# Patient Record
Sex: Female | Born: 1935
Health system: Southern US, Community
[De-identification: ages and names within clinical notes are randomized; demographics above are authoritative.]

## PROBLEM LIST (undated history)

## (undated) DIAGNOSIS — I10 Essential (primary) hypertension: Secondary | ICD-10-CM

## (undated) DIAGNOSIS — F0390 Unspecified dementia without behavioral disturbance: Secondary | ICD-10-CM

## (undated) HISTORY — PX: ABDOMINAL HYSTERECTOMY: SHX81

## (undated) HISTORY — PX: REPLACEMENT TOTAL KNEE BILATERAL: SUR1225

---

## 1999-10-31 ENCOUNTER — Encounter: Admission: RE | Admit: 1999-10-31 | Discharge: 1999-11-21 | Payer: Self-pay | Admitting: Orthopaedic Surgery

## 2000-02-14 ENCOUNTER — Encounter: Admission: RE | Admit: 2000-02-14 | Discharge: 2000-02-14 | Payer: Self-pay | Admitting: Internal Medicine

## 2000-02-14 ENCOUNTER — Encounter: Payer: Self-pay | Admitting: Internal Medicine

## 2000-10-22 ENCOUNTER — Encounter: Admission: RE | Admit: 2000-10-22 | Discharge: 2000-10-22 | Payer: Self-pay | Admitting: Internal Medicine

## 2000-10-22 ENCOUNTER — Encounter: Payer: Self-pay | Admitting: Internal Medicine

## 2001-02-16 ENCOUNTER — Encounter: Admission: RE | Admit: 2001-02-16 | Discharge: 2001-02-16 | Payer: Self-pay | Admitting: Internal Medicine

## 2001-02-16 ENCOUNTER — Encounter: Payer: Self-pay | Admitting: Internal Medicine

## 2001-12-27 ENCOUNTER — Encounter: Admission: RE | Admit: 2001-12-27 | Discharge: 2001-12-27 | Payer: Self-pay | Admitting: Internal Medicine

## 2001-12-27 ENCOUNTER — Encounter: Payer: Self-pay | Admitting: Internal Medicine

## 2002-01-21 ENCOUNTER — Emergency Department (HOSPITAL_COMMUNITY): Admission: EM | Admit: 2002-01-21 | Discharge: 2002-01-21 | Payer: Self-pay | Admitting: Emergency Medicine

## 2002-01-21 ENCOUNTER — Encounter: Payer: Self-pay | Admitting: Emergency Medicine

## 2002-02-17 ENCOUNTER — Encounter: Payer: Self-pay | Admitting: Internal Medicine

## 2002-02-17 ENCOUNTER — Encounter: Admission: RE | Admit: 2002-02-17 | Discharge: 2002-02-17 | Payer: Self-pay | Admitting: Internal Medicine

## 2003-02-21 ENCOUNTER — Encounter: Admission: RE | Admit: 2003-02-21 | Discharge: 2003-02-21 | Payer: Self-pay | Admitting: Internal Medicine

## 2003-02-21 ENCOUNTER — Encounter: Payer: Self-pay | Admitting: Internal Medicine

## 2003-09-22 ENCOUNTER — Ambulatory Visit (HOSPITAL_COMMUNITY): Admission: RE | Admit: 2003-09-22 | Discharge: 2003-09-22 | Payer: Self-pay | Admitting: Orthopaedic Surgery

## 2003-12-12 ENCOUNTER — Emergency Department (HOSPITAL_COMMUNITY): Admission: EM | Admit: 2003-12-12 | Discharge: 2003-12-12 | Payer: Self-pay | Admitting: Emergency Medicine

## 2004-02-23 ENCOUNTER — Encounter: Admission: RE | Admit: 2004-02-23 | Discharge: 2004-02-23 | Payer: Self-pay | Admitting: Internal Medicine

## 2005-02-01 ENCOUNTER — Ambulatory Visit: Payer: Self-pay | Admitting: Physical Medicine & Rehabilitation

## 2005-02-01 ENCOUNTER — Inpatient Hospital Stay (HOSPITAL_COMMUNITY): Admission: RE | Admit: 2005-02-01 | Discharge: 2005-02-07 | Payer: Self-pay | Admitting: Orthopaedic Surgery

## 2005-02-25 ENCOUNTER — Encounter: Admission: RE | Admit: 2005-02-25 | Discharge: 2005-02-25 | Payer: Self-pay | Admitting: Internal Medicine

## 2005-09-28 ENCOUNTER — Encounter: Admission: RE | Admit: 2005-09-28 | Discharge: 2005-09-28 | Payer: Self-pay | Admitting: Orthopaedic Surgery

## 2006-01-13 ENCOUNTER — Inpatient Hospital Stay (HOSPITAL_COMMUNITY): Admission: RE | Admit: 2006-01-13 | Discharge: 2006-01-14 | Payer: Self-pay | Admitting: Orthopaedic Surgery

## 2006-03-11 ENCOUNTER — Encounter: Admission: RE | Admit: 2006-03-11 | Discharge: 2006-03-11 | Payer: Self-pay | Admitting: Internal Medicine

## 2007-03-13 ENCOUNTER — Encounter: Admission: RE | Admit: 2007-03-13 | Discharge: 2007-03-13 | Payer: Self-pay | Admitting: Internal Medicine

## 2008-03-14 ENCOUNTER — Encounter: Admission: RE | Admit: 2008-03-14 | Discharge: 2008-03-14 | Payer: Self-pay | Admitting: Internal Medicine

## 2009-03-13 ENCOUNTER — Ambulatory Visit: Payer: Self-pay | Admitting: Internal Medicine

## 2009-03-13 ENCOUNTER — Ambulatory Visit: Payer: Self-pay | Admitting: Cardiology

## 2009-03-13 ENCOUNTER — Inpatient Hospital Stay (HOSPITAL_COMMUNITY): Admission: RE | Admit: 2009-03-13 | Discharge: 2009-03-18 | Payer: Self-pay | Admitting: Orthopaedic Surgery

## 2009-03-15 ENCOUNTER — Encounter (INDEPENDENT_AMBULATORY_CARE_PROVIDER_SITE_OTHER): Payer: Self-pay | Admitting: Orthopaedic Surgery

## 2009-03-16 ENCOUNTER — Ambulatory Visit: Payer: Self-pay | Admitting: Physical Medicine & Rehabilitation

## 2009-04-10 ENCOUNTER — Encounter: Admission: RE | Admit: 2009-04-10 | Discharge: 2009-04-10 | Payer: Self-pay | Admitting: Internal Medicine

## 2009-08-31 ENCOUNTER — Ambulatory Visit (HOSPITAL_COMMUNITY): Admission: RE | Admit: 2009-08-31 | Discharge: 2009-08-31 | Payer: Self-pay | Admitting: Gastroenterology

## 2010-04-11 ENCOUNTER — Encounter: Admission: RE | Admit: 2010-04-11 | Discharge: 2010-04-11 | Payer: Self-pay | Admitting: Internal Medicine

## 2010-07-20 IMAGING — CR DG CHEST 2V
2 series · 2 of 2 positions shown · non-contrast
Comparison: 01/30/2005.

CLINICAL DATA: 83-year-old female preoperative study for right knee
surgery.

CHEST - 2 VIEW

[view not recorded (1 of 2)]
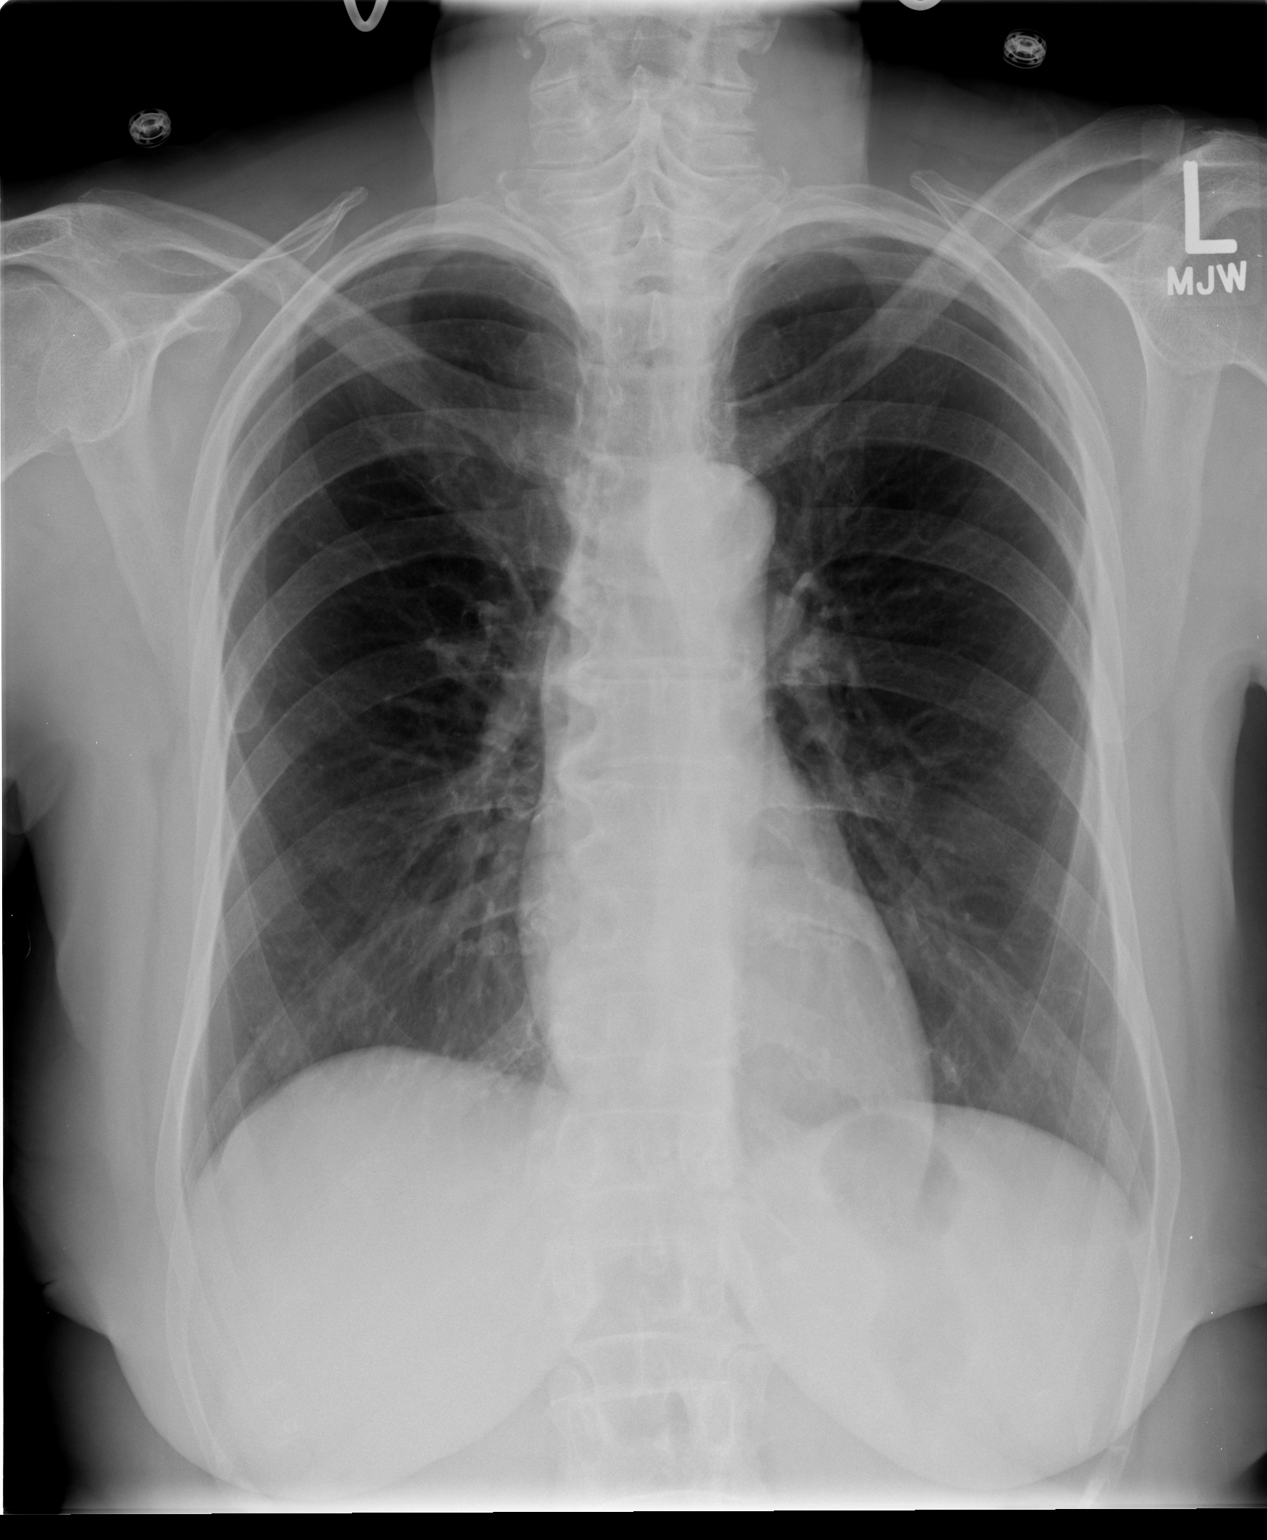

[view not recorded (2 of 2)]
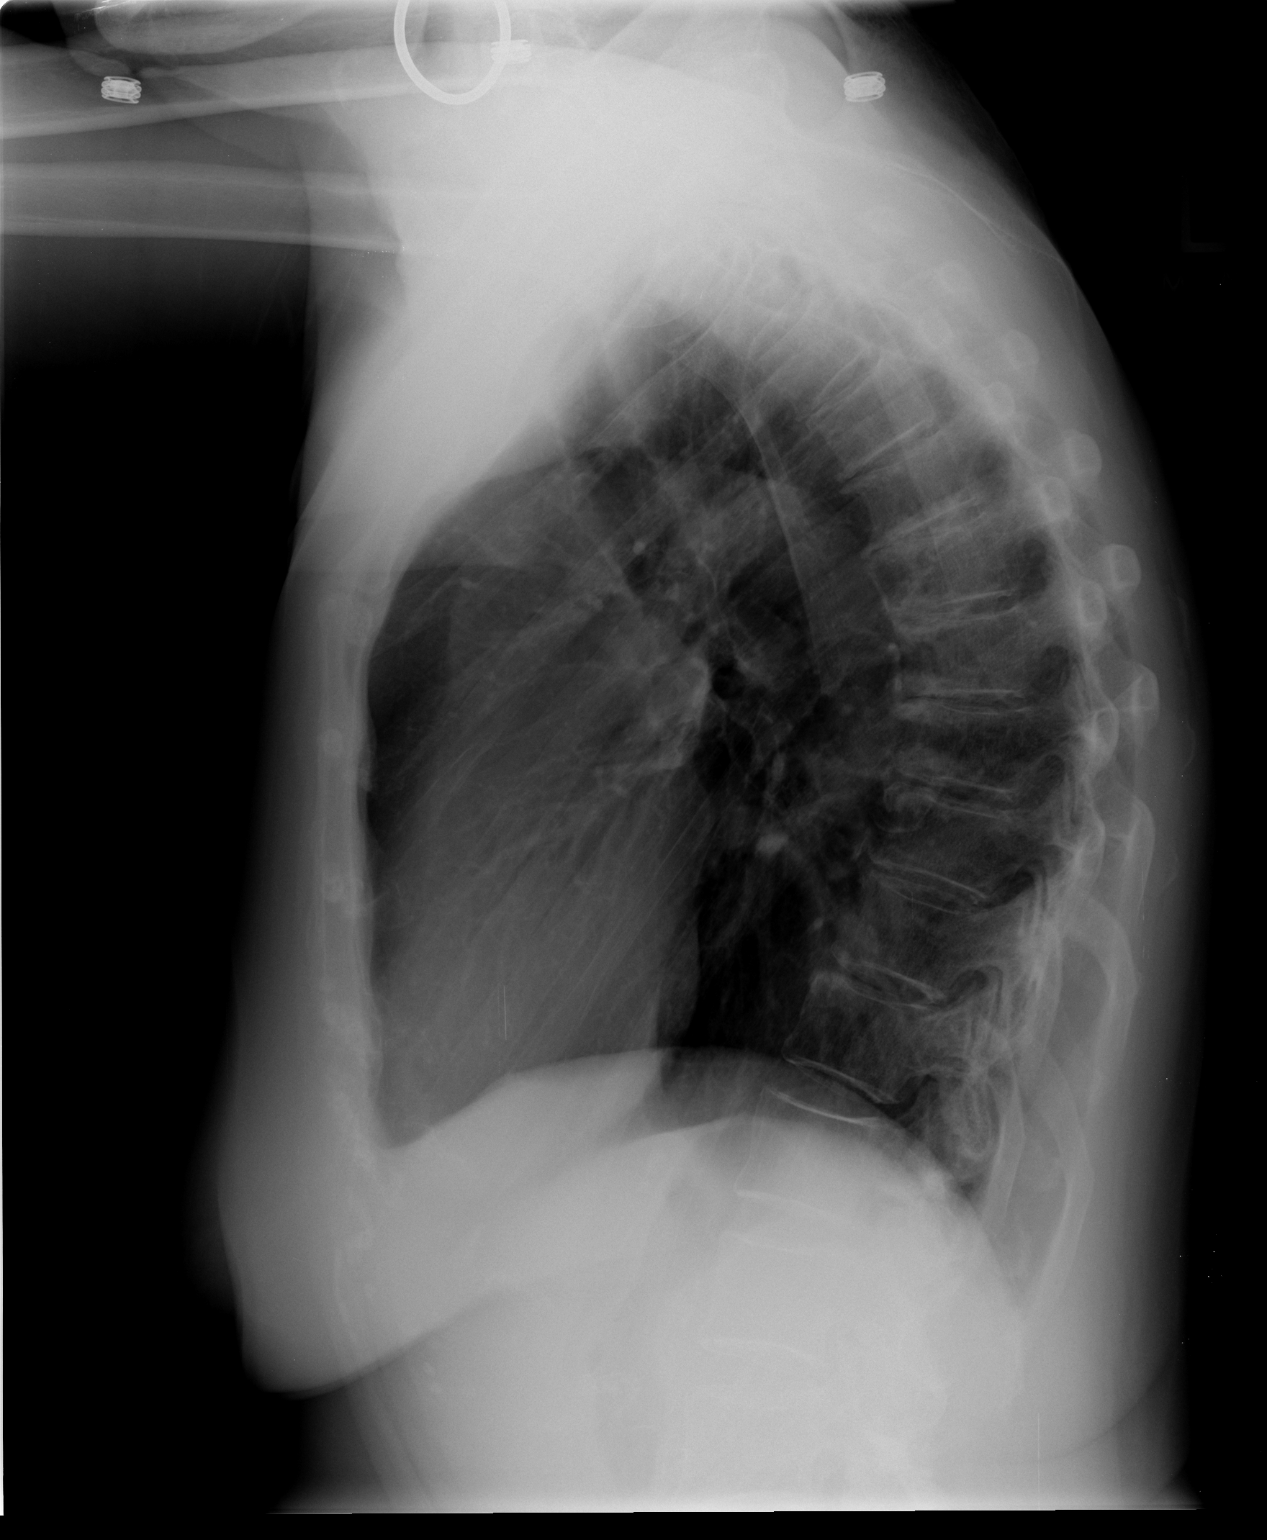

[2 of 2 positions shown; findings below may reference images not displayed]

FINDINGS: Stable lung volumes.  Cardiac size and mediastinal
contours are within normal limits.  The lungs are clear.  Mild
degenerative changes in the thoracic spine. No acute osseous
abnormality identified.
IMPRESSION: No acute cardiopulmonary abnormality.

## 2010-07-25 IMAGING — CR DG KNEE 1-2V PORT*R*
2 series · 2 of 2 positions shown · non-contrast
Comparison: None.

CLINICAL DATA: Status post right knee replacement.

PORTABLE RIGHT KNEE - 1-2 VIEW

[view not recorded (1 of 2)]
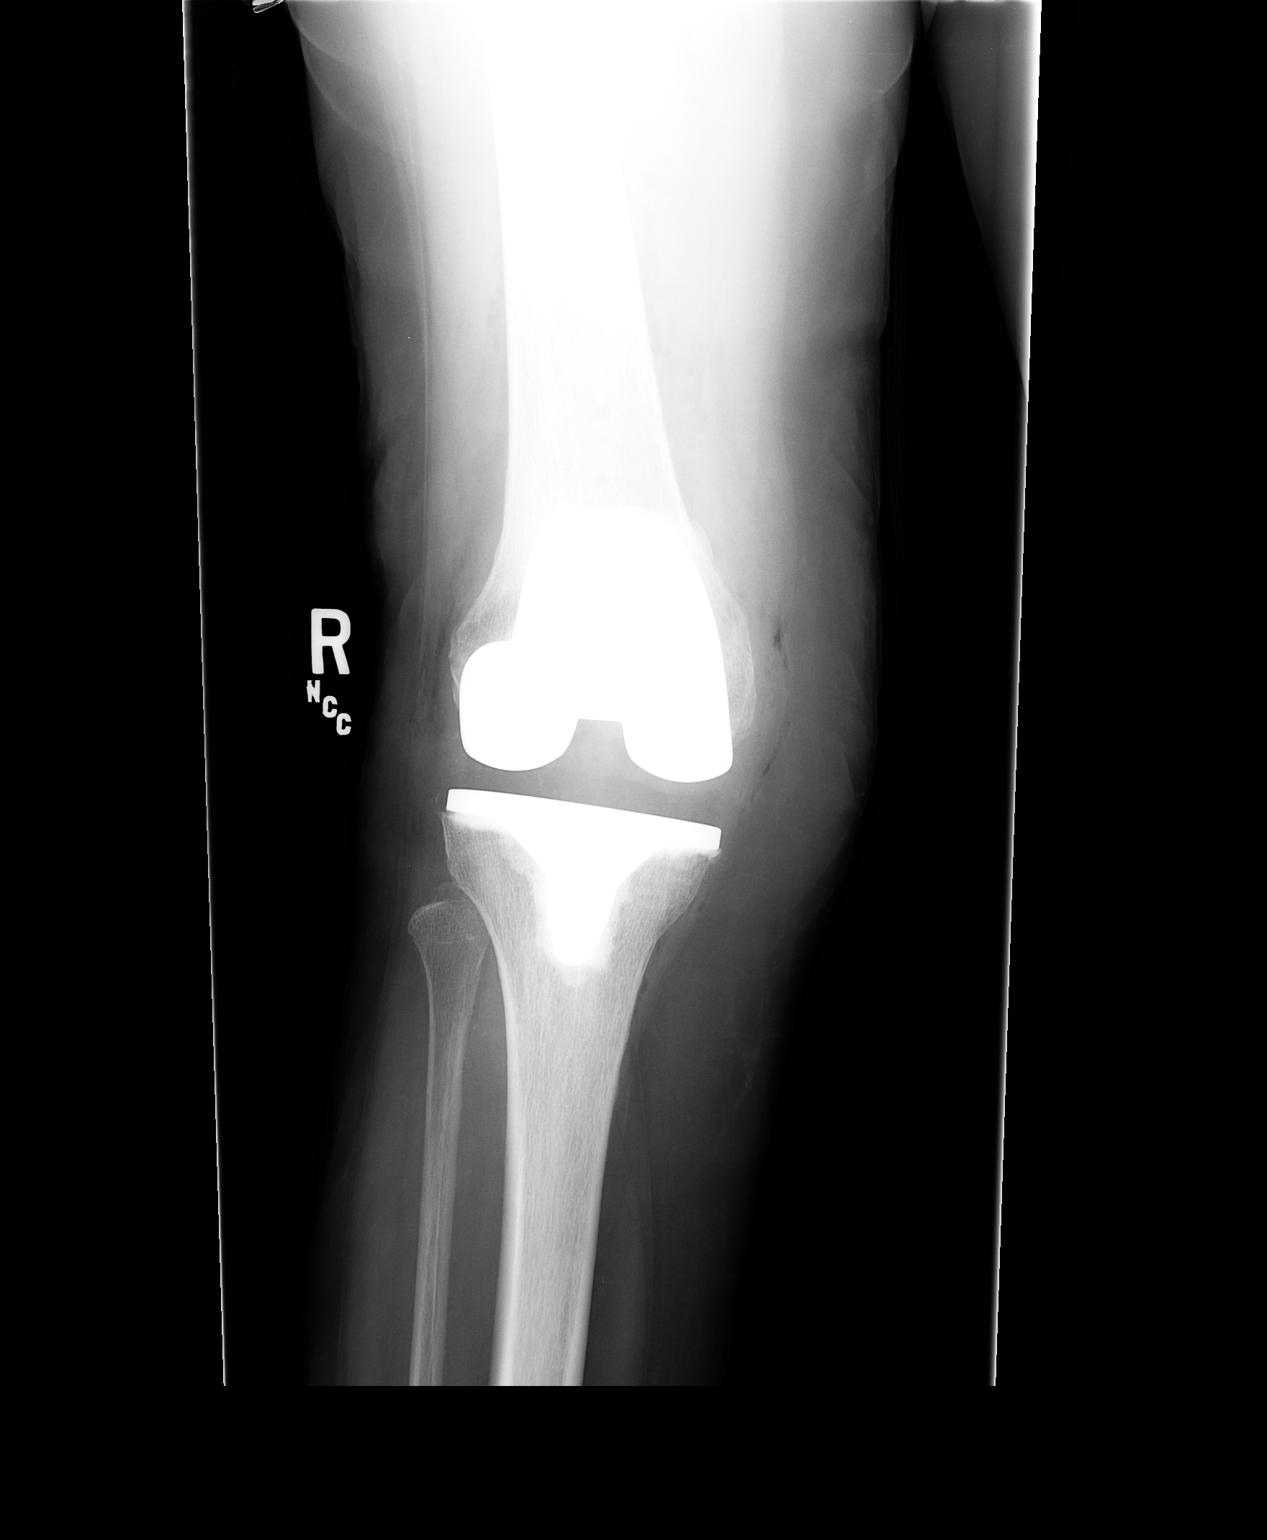

[view not recorded (2 of 2)]
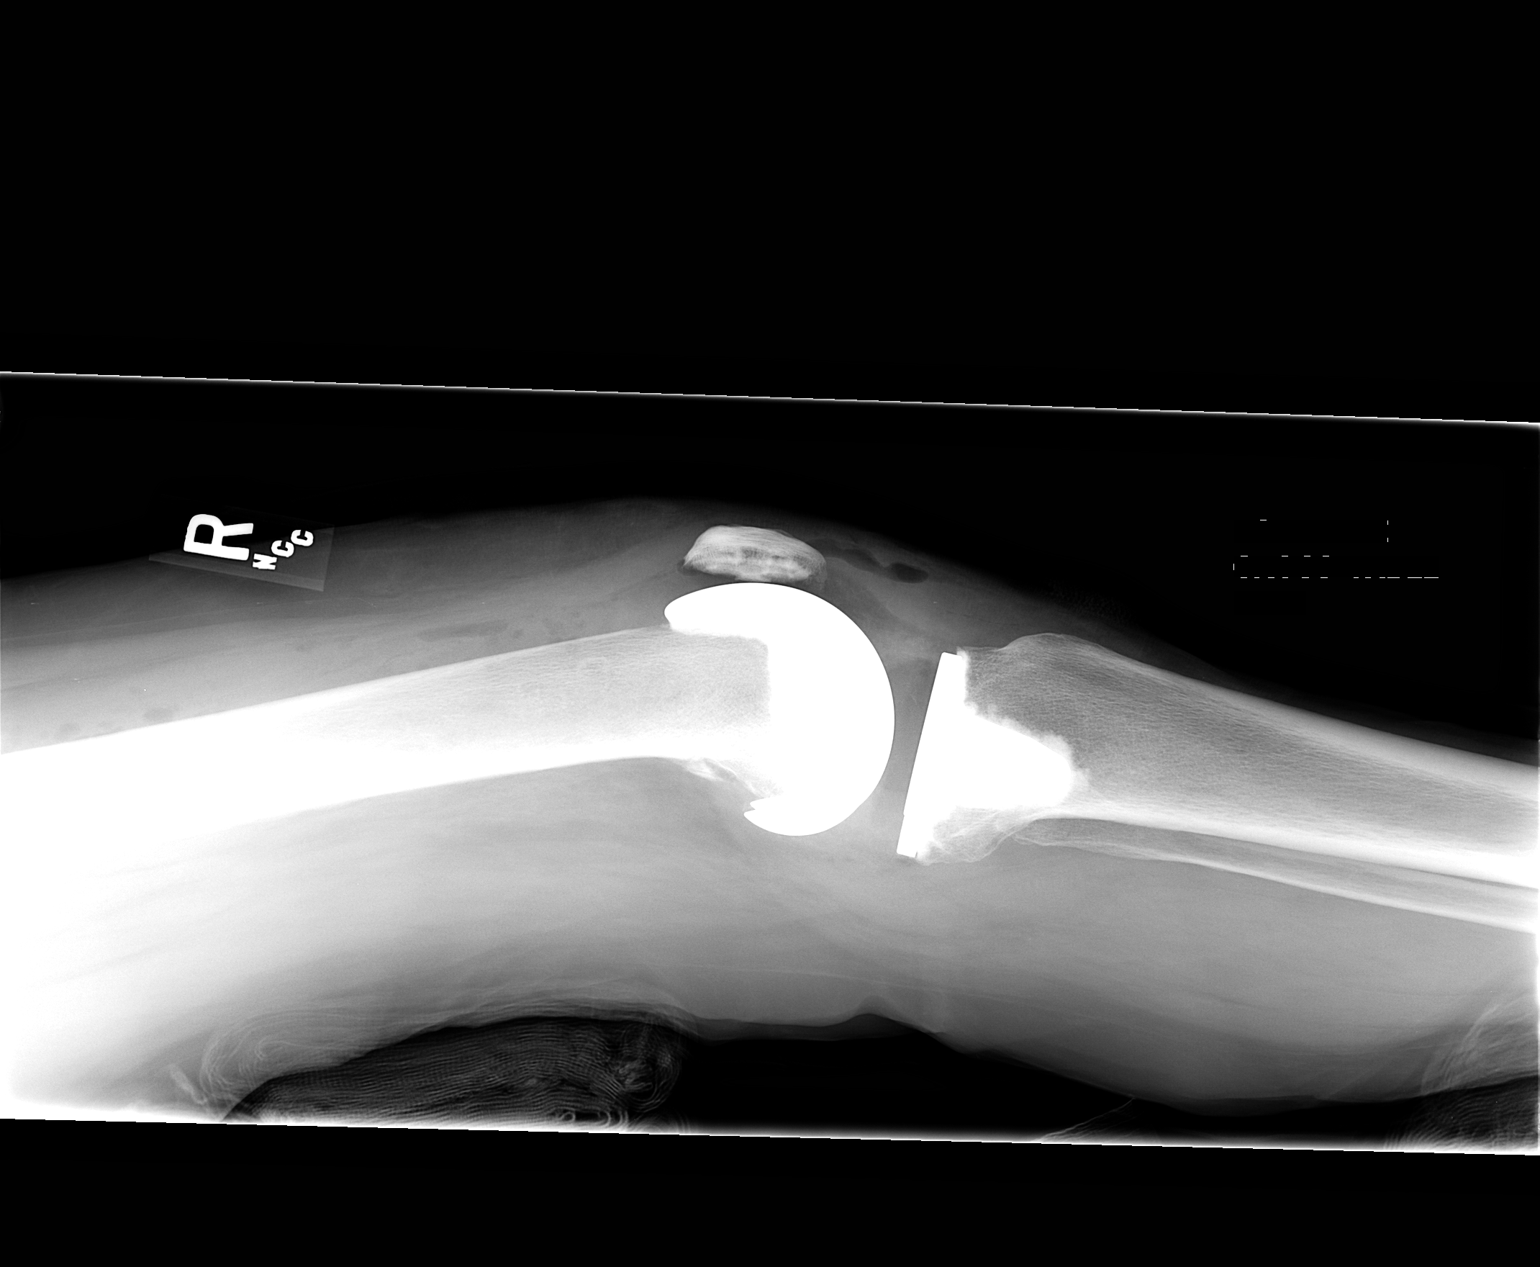

[2 of 2 positions shown; findings below may reference images not displayed]

FINDINGS: 8786 hours.  Two-view portable exam shows the patient to
be immediately status post right total knee replacement.  No
evidence for immediate hardware complications.  Gas within the
joint space in the soft tissues is consistent with the immediate
postoperative state.
IMPRESSION: No evidence for hardware complications status post right total knee
replacement.

## 2010-11-17 LAB — BASIC METABOLIC PANEL
BUN: 10 mg/dL (ref 6–23)
Calcium: 8.9 mg/dL (ref 8.4–10.5)
Chloride: 100 mEq/L (ref 96–112)
Chloride: 100 mEq/L (ref 96–112)
GFR calc Af Amer: >60 mL/min (ref >60)
Glucose, Bld: 155 mg/dL — ABNORMAL HIGH (ref 70–99)
Glucose, Bld: 167 mg/dL — ABNORMAL HIGH (ref 70–99)
Potassium: 3.4 mEq/L — ABNORMAL LOW (ref 3.5–5.1)
Potassium: 3.9 mEq/L (ref 3.5–5.1)
Sodium: 137 mEq/L (ref 135–145)

## 2010-11-17 LAB — CARDIAC PANEL(CRET KIN+CKTOT+MB+TROPI)
Relative Index: INVALID (ref 0.0–2.5)
Total CK: 10 U/L (ref 7–177)
Troponin I: 0.02 ng/mL (ref 0.00–0.06)

## 2010-11-17 LAB — CBC
HCT: 29.4 % — ABNORMAL LOW (ref 36.0–46.0)
HCT: 34.7 % — ABNORMAL LOW (ref 36.0–46.0)
Hemoglobin: 8.9 g/dL — ABNORMAL LOW (ref 12.0–15.0)
MCHC: 33.8 g/dL (ref 30.0–36.0)
MCHC: 34.1 g/dL (ref 30.0–36.0)
MCV: 87.6 fL (ref 78.0–100.0)
MCV: 88 fL (ref 78.0–100.0)
MCV: 88.7 fL (ref 78.0–100.0)
Platelets: 228 10*3/uL (ref 150–400)
Platelets: 254 10*3/uL (ref 150–400)
RBC: 2.97 MIL/uL — ABNORMAL LOW (ref 3.87–5.11)
RBC: 3.69 MIL/uL — ABNORMAL LOW (ref 3.87–5.11)
RDW: 12.6 % (ref 11.5–15.5)
RDW: 12.6 % (ref 11.5–15.5)
RDW: 12.6 % (ref 11.5–15.5)
WBC: 10.8 10*3/uL — ABNORMAL HIGH (ref 4.0–10.5)
WBC: 9.9 10*3/uL (ref 4.0–10.5)

## 2010-11-17 LAB — PROTIME-INR
INR: 1.1 (ref 0.00–1.49)
INR: 1.5 (ref 0.00–1.49)
INR: 1.7 — ABNORMAL HIGH (ref 0.00–1.49)
Prothrombin Time: 14.4 seconds (ref 11.6–15.2)
Prothrombin Time: 18.4 seconds — ABNORMAL HIGH (ref 11.6–15.2)
Prothrombin Time: 22.8 seconds — ABNORMAL HIGH (ref 11.6–15.2)

## 2010-11-18 LAB — URINE MICROSCOPIC-ADD ON

## 2010-11-18 LAB — URINALYSIS, ROUTINE W REFLEX MICROSCOPIC
Bilirubin Urine: NEGATIVE
Glucose, UA: NEGATIVE mg/dL
Hgb urine dipstick: NEGATIVE
Ketones, ur: NEGATIVE mg/dL
Nitrite: NEGATIVE
Protein, ur: NEGATIVE mg/dL
Specific Gravity, Urine: 1.012 (ref 1.005–1.030)
Urobilinogen, UA: 0.2 mg/dL (ref 0.0–1.0)
pH: 6 (ref 5.0–8.0)

## 2010-11-18 LAB — CBC
HCT: 41 % (ref 36.0–46.0)
Hemoglobin: 13.8 g/dL (ref 12.0–15.0)
MCHC: 33.5 g/dL (ref 30.0–36.0)
MCV: 87.6 fL (ref 78.0–100.0)
Platelets: 237 K/uL (ref 150–400)
RBC: 4.69 MIL/uL (ref 3.87–5.11)
RDW: 12.3 % (ref 11.5–15.5)
WBC: 10.1 K/uL (ref 4.0–10.5)

## 2010-11-18 LAB — COMPREHENSIVE METABOLIC PANEL WITH GFR
ALT: 35 U/L (ref 0–35)
AST: 30 U/L (ref 0–37)
Albumin: 4.5 g/dL (ref 3.5–5.2)
Alkaline Phosphatase: 98 U/L (ref 39–117)
BUN: 15 mg/dL (ref 6–23)
CO2: 29 meq/L (ref 19–32)
Calcium: 10.2 mg/dL (ref 8.4–10.5)
Chloride: 99 meq/L (ref 96–112)
Creatinine, Ser: 0.75 mg/dL (ref 0.4–1.2)
GFR calc non Af Amer: >60 mL/min
Glucose, Bld: 108 mg/dL — ABNORMAL HIGH (ref 70–99)
Potassium: 3.2 meq/L — ABNORMAL LOW (ref 3.5–5.1)
Sodium: 139 meq/L (ref 135–145)
Total Bilirubin: 0.8 mg/dL (ref 0.3–1.2)
Total Protein: 7.1 g/dL (ref 6.0–8.3)

## 2010-11-18 LAB — DIFFERENTIAL
Eosinophils Absolute: 0 10*3/uL (ref 0.0–0.7)
Lymphocytes Relative: 21 % (ref 12–46)
Lymphs Abs: 2.1 10*3/uL (ref 0.7–4.0)
Monocytes Relative: 3 % (ref 3–12)
Neutrophils Relative %: 75 % (ref 43–77)

## 2010-11-18 LAB — PROTIME-INR
INR: 1 (ref 0.00–1.49)
Prothrombin Time: 13.8 seconds (ref 11.6–15.2)

## 2010-12-25 NOTE — Discharge Summary (Signed)
[name], [name]               ACCOUNT NO.:  [id number]   MEDICAL RECORD NO.:  [id number]          PATIENT TYPE:  INP   LOCATION:  5035                         FACILITY:  [facility]   PHYSICIAN:  Mark C. Ophelia Charter, M.D.    DATE OF BIRTH:  1926/01/29   DATE OF ADMISSION:  03/13/2009  DATE OF DISCHARGE:  03/18/2009                               DISCHARGE SUMMARY   ADMISSION DIAGNOSES:  1. Osteoarthritis of the right knee.  2. Hypertension.  3. Anxiety with history of anxiety attacks.  4. Cataracts.  5. Status post left total knee arthroplasty.   DISCHARGE DIAGNOSES:  1. Osteoarthritis of the right knee.  2. Hypertension.  3. Anxiety with history of anxiety attacks.  4. Cataracts.  5. Status post left total knee arthroplasty.  6. Syncopal episode with hypotension.  7. Posthemorrhagic anemia.   PROCEDURE:  On March 13, 2009, the patient underwent right total knee  arthroplasty performed by Dr. Ophelia Charter, assisted by Maud Deed, [facility]  under general anesthesia.   CONSULTATIONS:  Duke Salvia, MD, [facility]   BRIEF HISTORY:  [name] is an 77 year old female who is status post  left total knee arthroplasty in 2006 followed by closed manipulation for  arthrofibrosis in 2006 as well.  She has been doing well with her left  knee and recently began having symptoms of discomfort in the right knee  that had been progressive and are now debilitating.  She has tried  conservative treatment including intra-articular steroid injections,  physical therapy, and medications, however, the pain is keeping her from  doing her usual activities of daily living.  Radiographs show  significant tricompartmental osteoarthritis of the right knee.  It was  felt she would benefit from surgical intervention and was admitted for  the procedure as stated above.   BRIEF HOSPITAL COURSE:  The patient tolerated the procedure under  general anesthesia with a femoral nerve block without complications.  Postoperatively, neurovascular motor function of the lower extremities  was noted to be intact.  Late in the evening on the day of surgery, the  patient had an episode of being unresponsive and low blood pressure with  a low heart rate as well.  Code blue was called, however, the patient  was responsive with normal blood pressure and heart rate when the team  arrived.  No further incident through the evening and the patient was  stable.  EKG was done and no changes were noted.  Cardiac enzymes were  within normal limits.  On the first postoperative day when she had been  out of bed with physical therapy, she had another incident of being  unresponsive with decreased heart rate and blood pressure.  Once again,  she returned quickly to normal findings on her heart rate and blood  pressure.  She was transferred to the telemetry unit and a cardiac  consult was obtained.  It was felt that she had syncopal episodes which  were multifactorial, probably vasomotor incident.  She was monitored on  the telemetry floor while her physical therapy was continued.  A 2-D  echocardiogram was done  with findings consistent with hyperdynamic LV  function and sinus tachycardia.  She remained on the telemetry floor for  a couple of days at which time she was started on a beta-blocker and her  heart rate and blood pressure was more stable.  The patient was  comfortable using PCA analgesics and was weaned to p.o. analgesics and  eventually was able to progress well with the physical therapy provided.  She was able to ambulate 100 feet.  She utilized a CPM for passive range  of motion and also performed active range of motion to the right knee.  She was able to get 68 degrees of flexion on the right knee.  Knee  immobilizer was utilized for ambulation until the patient was able to do  a straight leg raise.  Her dressing was changed daily and her wound was  healing without signs of infection.  Coumadin was used for  DVT  prophylaxis with adjustments in dose made according to daily Protimes by  the pharmacist.  Once the patient was felt to be stable from a cardiac  standpoint, she was transferred to the orthopedic floor.  During that  time-frame, she received a consult through the [facility] and also through the case manager for possible transfer  to rehab versus skilled nursing facility, however, in light of the fact  that she had a significant amount of family support at home and was  nearing independent level.  She was felt safe for discharge to her home.  Dr. Graciela Husbands spoke with Dr. Earl Gala over the telephone who is the patient's  usual medical physician and further cardiac evaluation will be done on  an outpatient basis with Dr. Earl Gala.   PERTINENT LABORATORY VALUES:  CBC on admission within normal limits.  Hemoglobin and hematocrit 13.8 and 41.  WBC elevated as high as 12.2  postoperatively, however, returning to normal value of 10.8 prior to  discharge.  Last hemoglobin noted to be 8.9 with hematocrit 26.4 on  March 16, 2009.  INR at discharge was 2.0.  The patient had hypokalemia  as low as 3.2 with a value of 3.9 on discharge.  Urinalysis on admission  negative for urinary tract infection.  Thyroid testing with normal TSH  at 0.446.   PLAN:  The patient was discharged to her home.  Arrangements made  through [facility] for durable medical equipment.  The patient  had most equipment available to her as she had had previous knee  replacement.  They will also provide home health physical therapy and  Coumadin management.  She was given prescriptions for Percocet and  Coumadin.  She will continue to be ambulatory with weightbearing as  tolerated utilizing a knee immobilizer until she can do a straight leg  raise.  She will use a walker.  She will work on range of motion of the  knee.  She will also work on stretching exercises to obtain full  extension.   Dressing change to be done daily.  She may shower if there  is no wound drainage.  The patient will follow up with Dr. Ophelia Charter in 2  weeks.  She is instructed to make an appointment to follow up with Dr.  Earl Gala as well.  She will continue home medications as taken prior to  admission with the exception of hydrocodone which she will use Percocet  for pain instead.  All questions encouraged and answered.   CONDITION ON DISCHARGE:  Stable.  Wende Neighbors, P.A.      Mark C. Ophelia Charter, M.D.  Electronically Signed    SMV/MEDQ  D:  03/30/2009  T:  03/31/2009  Job:  [id number]

## 2010-12-25 NOTE — Consult Note (Signed)
[name], [name]               ACCOUNT NO.:  [id number]   MEDICAL RECORD NO.:  [id number]          PATIENT TYPE:  INP   LOCATION:  4729                         FACILITY:  [facility]   PHYSICIAN:  Kathryn Salvia, MD, FACCDATE OF BIRTH:  01-Apr-1926   DATE OF CONSULTATION:  03/14/2009  DATE OF DISCHARGE:                                 CONSULTATION   PRIMARY CARE PHYSICIAN:  Kathryn Millard, MD   PRIMARY CARDIOLOGIST:  None.   CHIEF COMPLAINT:  Syncope.   HISTORY OF PRESENT ILLNESS:  [name] is an 77 year old female with no  previous history of coronary artery disease.  She has had 3 syncopal  episodes in her life, all of them associated with surgery.  In 2006  after a left total knee replacement, she had a syncopal episode that she  states they felt was secondary to orthostatic hypotension.  She had a  right total knee replacement yesterday.  Last p.m., she fell weak after  standing up.  She sat down and felt weak.  She woke up with people  around her.  According to the times listed by the nursing staff, the  incident lasted from 2315-2335.  Her blood pressure was documented at a  nadir of 58/30 with a heart rate of 47.  Her respiratory rate was  decreased.  She did not require resuscitation.  When she became  responsive, her level of consciousness was normal.  There was no seizure  activity witnessed and she had no incontinence of bowel (Foley in  place).  Today, [name] sat on the side of the bed for several  minutes.  She then stood up and got to the chair.  After she got to a  chair, she became unresponsive.  The nurse states that she tried  multiple times to arouse her by verbal and physical stimulation.  For a  few minutes, she was unsuccessful.  Code blue was called.  By the  arrival of the code blue team, she was responsive and her blood pressure  and heart rate were within normal limits.  Per her nurse, her blood  pressure during the episode was 62/29 with a heart rate of  91.  [name] remembers feeling slightly nauseated and feeling her heart rate go  up.  She then woke up with people around her.  There was no dizziness,  no prodrome, no aura.  When she woke up, she was completely alert and  oriented and had no confusion or residual symptoms whatsoever.   [name] states that she has had panic attacks almost all of her life.  She describes a feeling of jitteriness in her abdomen and states that  she will feel her heart rate speed up and her hand has been feel jittery  as well.  The symptoms can be brought on a by stress but also occur  spontaneously.  She thinks that she had one of these panic attacks today  but has never had presyncope or syncope secondary to them.  She states  that she will try to calm herself down and talk herself  out of it and is  frequently successful.  She is currently asymptomatic.   PAST MEDICAL HISTORY:  1. Hypertension.  2. Anxiety.  3. Osteoarthritis.  4. Cataracts.   PAST SURGICAL HISTORY:  1. She is status post right total knee replacement on March 13, 2009,      left total knee replacement in 2006.  2. Back surgery in 2007.  3. Remote hysterectomy.  4. Heart catheterization by her description in 1980 secondary to chest      pain after her mother's death, which was reportedly okay.   ALLERGIES:  She is allergic to MOTRIN with hives.   MEDICATIONS PRIOR TO ADMISSION:  1. Diovan HCT 20/25.  2. Xanax p.r.n.  3. Vicodin.  An   SOCIAL HISTORY:  She has no significant history of alcohol, tobacco, or  drug abuse.  She works 4 hours a day in [employer].  She lives with her  grandson and has other family members nearby.  She is a widow.   FAMILY HISTORY:  Her mother died at age 76 with a history of heart  disease and her father died at an unknown age, she thinks of a CVA but  has no other information.  She is an only child.   REVIEW OF SYSTEMS:  Other than the HPI include joint pain, occasional  nausea, but no  melena.  She has had no recent illnesses, fevers, or  chills.  Full 14-review of systems is, otherwise, negative.   PHYSICAL EXAMINATION:  VITAL SIGNS:  Temperature is 97.8, blood pressure  162/65, heart rate 109, respiratory rate 20, O2 saturation 100% on 2  liters.  GENERAL:  She is a well-developed elderly African American female in no  acute distress.  HEENT:  Essentially normal.  NECK:  There is no JVD.  She has bilateral carotid bruits that are  questionably secondary to radiation of murmur.  There is no thyromegaly  and no lymphadenopathy noted.  CHEST:  Clear to auscultation bilaterally.  CV:  Regular rate and rhythm with an S1 and S2 audible and a systolic  murmur is noticed most prominently at the left lower sternal border,  3/6.  ABDOMEN:  Soft and nontender with active bowel sounds.  No  hepatosplenomegaly by palpation.  MUSCULOSKELETAL:  There is no joint deformity or effusions except for  her right knee is bandaged and dressed.  There is no spine or CVA  tenderness.  EXTREMITIES:  Distal pulses are intact in all 4 extremities.  No edema  is noted.  SKIN:  No rashes or lesions are noted.  NEURO:  She is alert and oriented with no focal deficits noted.   LABORATORY VALUES:  Hemoglobin 11, hematocrit 32.3, WBCs 9.9, platelets  254.  INR 1.1.  Sodium 137, potassium 3.4, chloride 100, CO2 of 30, BUN  10, creatinine 0.77, glucose 167.  CK, MB, and troponin I negative x2.   Chest x-ray performed on March 08, 2009, no acute disease.   IMPRESSION:  [name] was seen today by Dr. Sherryl Martinez.  She is being  evaluated for syncope.  This is possibly multifactorial and probably  vasomotor secondary to circumstances associated with a postop state.  She has no history of syncope, although she does have a history of panic  attacks and tachy palpitations.  She should be on telemetry for now.  Because of her echo, we will check a 2-D echocardiogram.  We will also  check a TSH.  Her  family physician  was contacted, and she has not had a  recent echo or a TSH within the last 3-4 years.  She is starting to take  p.o.'s well and continues to receive IV fluids.  We will check  orthostatic vital signs with the patient standing for 5 minutes if she  will tolerate it.  Further evaluation and treatment will depend on the  results of the above testing.    We appreciate the opportunity to consult on this very nice and generally  very healthy lady.      Theodore Demark, PA-C      Kathryn Salvia, MD, [facility]  Electronically Signed    RB/MEDQ  D:  03/14/2009  T:  03/15/2009  Job:  [id number]   cc:   [name], M.D.

## 2010-12-25 NOTE — Op Note (Signed)
Kathryn, Martinez               ACCOUNT NO.:  [id number]   MEDICAL RECORD NO.:  [id number]          PATIENT TYPE:  INP   LOCATION:  5039                         FACILITY:  [facility]   PHYSICIAN:  Mark C. Ophelia Charter, M.D.    DATE OF BIRTH:  [birth year]   DATE OF PROCEDURE:  03/13/2009  DATE OF DISCHARGE:                               OPERATIVE REPORT   PREOPERATIVE DIAGNOSIS:  Right knee osteoarthritis.   POSTOPERATIVE DIAGNOSIS:  Right knee osteoarthritis.   PROCEDURE:  Right total knee arthroplasty.   SURGEON:  Mark C. Ophelia Charter, MD   ASSISTANT:  Wende Neighbors, PA-C   ANESTHESIA:  GOT plus preoperative femoral nerve block and Marcaine skin  local.   DRAINS:  None.   TOURNIQUET TIME:  An hour and 15 minutes.   COMPONENTS IMPLANTED:  DePuy PFC rotating platform, computer assisted,  #3 femur, #3 tibia, 35-mm all poly patella, and 10-mm mobile bearing  platform.   PROCEDURE:  After induction of general anesthesia, orotracheal  intubation with preoperative femoral nerve block, preoperative Ancef  prophylaxis, standard prepping and draping, sterile skin marker,  Betadine Vi-Drape, and then surgical checklist completion, leg was  wrapped in Esmarch and tourniquet inflated to 350 and midline incision  was made cutting through the superficial retinaculum.  Hemostasis  obtained with Bovie electrocautery.  Deep retinaculum was opened with  the medial parapatellar incision, splitting the quad tendon between the  medial one-third and lateral two-thirds.  Meniscectomies were performed.  There was significant lateral arthritis with grade 4 changes with bone  erosion laterally less medially with degenerative medial meniscal tear  and significant patellofemoral arthritis.  Large spurs were present on  the patella and it was cut from facet, removing 10 mm of bone.  After  the pins were placed for computer models, tibial and femoral models were  generated.  There was flexion deformity and also  valgus deformity of 13  degrees which corresponded with preoperative x-rays.  A 11 mm were  initially taken off the femur due to flexion contracture and after all  cuts had been made, we had to come back and take an additional 2 mm off  the distal femur since she was not able reach full extension but was  well balanced in flexion.  Chamfer cuts were made, box cut was waited  until the tibia had been cut, 10 mm removed on the tibia, and 10-mm  trial spacer was used.  Once final revision cut had been made distally  in the femur and the 2 chamfer cuts had been made, the box cut was  deepened and then had to be rasped around so that the prosthesis would  fit down securely and would not toggle in the middle.  Trial showed full  extension, excellent flexion/extension balance.  Posterior spurs had  been removed as well as releasing the posterior capsule and complete  resection of PCL and ACL.  Pulsatile lavage was used and the cement was  vacuum mixed, inserted with tibia cemented first followed by femur,  placement of 10-mm permanent poly, and then cementing the patella with  the 3 pegs filled with cement and cement on the back of the patella as  well as on the cut patellar surface.  All excess cement had been removed  thickly of the back portion around of the tibia.  No cement was present  in the lateral gutters.  Once cement was hard to 16 minutes, tourniquet  was deflated, and hemostasis obtained and layered closure with #1 Tycron  in the deep layer, 2-0 Vicryl in the superficial retinaculum,  subcutaneous tissue, and subcuticular skin closure.  Tincture of  Benzoin, Steri-Strips, Marcaine infiltration, postop dressing, and knee  immobilizer.  Instrument count and needle count were correct.  Computer  pins were removed at the end of the case and Vicryl suture was placed in  the 2 tibial computer pin sites that were used for computer navigation  for the case.  The 2 pins that were bicortical  on the femoral side  replaced inside the incision and were removed prior to closure.  The  patient tolerated the procedure well, had intact pulses, reached full  extension and flexion to 140 degrees with stable collateral ligament  balance.  Surgical checklist was completed at the time of closure.      Mark C. Ophelia Charter, M.D.  Electronically Signed     MCY/MEDQ  D:  03/13/2009  T:  03/14/2009  Job:  [id number]

## 2010-12-28 NOTE — Discharge Summary (Signed)
[name], [name]               ACCOUNT NO.:  [id number]   MEDICAL RECORD NO.:  [id number]          PATIENT TYPE:  INP   LOCATION:  5009                         FACILITY:  [facility]   PHYSICIAN:  Mark C. Ophelia Charter, M.D.    DATE OF BIRTH:  [birth year]   DATE OF ADMISSION:  02/01/2005  DATE OF DISCHARGE:  02/07/2005                                 DISCHARGE SUMMARY   PREOPERATIVE DIAGNOSIS:  Left knee osteoarthritis.   ADDITIONAL DIAGNOSIS:  Hypertension.   PROCEDURE:  Left total knee arthroplasty on February 01, 2005.   This is a 77 year old female with progressive left knee osteoarthritis who  failed conservative treatment. He was admitted for left total knee  arthroplasty. Preoperative labs included hemoglobin of 11.2, normal PT and  PTT, normal urinalysis, glucose elevated at 170.   HOSPITAL COURSE:  The patient was admitted and underwent cemented DePuy  total knee arthroplasty. A size 4 femur and 10-mm insert. A size 3 tibia  cemented.  A 35-mm patellar component. Postoperatively she was on pharmacy  Coumadin, DVT prophylaxis, total knee arthroplasty pathway with PT and OT  consultation using the CPM.  Hemoglobin was 11.2 on postoperative day one.  She was using physical therapy and she took five steps on postoperative day  two. She was making slow progress noted by PT and OT. SACU was recommended.  Hemoglobin was 9.6, INR 1.8.  Over the next couple of days she made better  progress and was at that the point where she felt she was ready to go home  with home PT, staying with her daughter. Arrangements were made from home  health RN for protime draws, Coumadin 3 mg times three weeks, Tylox for  pain. She is flexing past 70 degrees at discharge. Hospital followup one  week after discharge.   CONDITION ON DISCHARGE:  Improved.      Mark C. Ophelia Charter, M.D.  Electronically Signed     MCY/MEDQ  D:  04/10/2005  T:  04/11/2005  Job:  [id number]

## 2010-12-28 NOTE — Op Note (Signed)
[name], [name]               ACCOUNT NO.:  [id number]   MEDICAL RECORD NO.:  [id number]          PATIENT TYPE:  INP   LOCATION:  5009                         FACILITY:  [facility]   PHYSICIAN:  Mark C. Ophelia Charter, M.D.    DATE OF BIRTH:  [birth year]   DATE OF PROCEDURE:  02/01/2005  DATE OF DISCHARGE:                                 OPERATIVE REPORT   PREOPERATIVE DIAGNOSIS:  Left knee osteoarthritis.   POSTOPERATIVE DIAGNOSIS:  Left knee osteoarthritis.   OPERATION PERFORMED:  Left total knee arthroplasty.   SURGEON:  Mark C. Ophelia Charter, M.D.   ANESTHESIA:  General orotracheal anesthesia.   ESTIMATED BLOOD LOSS:  Less than 100 mL.   TOURNIQUET TIME:  One hour 38 minutes times 350.   DESCRIPTION OF PROCEDURE:  After induction of general anesthesia,  preoperative Ancef, standard prepping and draping, midline incision was  made.  The tourniquet was inflated after Esmarch application.  Medial  parapatellar incision was made splitting the quad tendon between the medial  one third and the lateral two thirds.  Depuy rotating platform prosthesis  was selected and intramedullary hole was drilled up the femur.  Distal cut  was made taking 14 mm of bone.  Sizing was appropriate for a #4.  Chamfer  cuts, box cuts were made on the tibia.  A #3 was appropriate size.  Initially, not quite enough tibia was taken.  A second cut was made removing  another 4 mm which allowed the spacer block to fit nicely, allowed full  extension.  Keel holes were made.  Patella was cut removing 9.5 mm of bone  and 35 mm patella was selected.  Pulse lavage was used after trials were  inserted.  Bone was removed off the posterior aspect of the femur.  Menisci  were resected.  The bone was dried.  Cement was vacuum mixed, placed on the  tibia first, followed by cementing the femur, placing the 190 mm spacer and  then finally the patella.  Cement was hardened 15 minutes.  All excess  cement had been removed, particularly in  the back portion of the knee.  Deep  cuts were closed with #1 Tycron and Ethibond, 2-0  Vicryl in the  subcutaneous tissue, skin staple closure.  20 cc of Marcaine in the skin  incision and some in the knee.  For postoperative analgesia and the  preoperative femoral nerve block was done by the anesthesia team.  The  patient tolerated the procedure well and was transferred to the recovery  room in stable condition.       MCY/MEDQ  D:  02/01/2005  T:  02/01/2005  Job:  [id number]

## 2010-12-28 NOTE — Op Note (Signed)
[name], [name]               ACCOUNT NO.:  [id number]   MEDICAL RECORD NO.:  [id number]          PATIENT TYPE:  INP   LOCATION:  2899                         FACILITY:  [facility]   PHYSICIAN:  Mark C. Ophelia Charter, M.D.    DATE OF BIRTH:  [birth year]   DATE OF PROCEDURE:  01/13/2006  DATE OF DISCHARGE:                                 OPERATIVE REPORT   PREOPERATIVE DIAGNOSIS:  L2-3, L3-4 spinal stenosis.   POSTOPERATIVE DIAGNOSIS:  L2-3, L3-4 spinal stenosis.   OPERATION PERFORMED:  L2-3, L3-4 central decompression and microscope  foraminotomy.   SURGEON:  Mark C. Ophelia Charter, M.D.   ANESTHESIA:  General.   DESCRIPTION OF PROCEDURE:  After induction of general anesthesia with  orotracheal intubation, the back was prepped with DuraPrep, the area squared  with towels, Betadine Vi-drape applied and laminectomy sheet.  Midline  incision was made after needle localization with a spinal needle was  performed.  Operative level was first addressed at 2-3 since it was the  tightest.  There was some multifactorial spinal stenosis.  Subperiosteal  dissection of the lamina was performed using a combination of rongeur and  the 4 mm diamond tipped bur.  Central decompression was performed.  There  are hypertrophic large chunks of disk and more overhand of the foramina on  the left side than the right, causing lateral compression of the dura.  Chunks of ligament were removed.  The operative microscope was draped and  was used for visualization and foraminotomy continued with removal of bone  along the lateral gutters until bone was removed out to the level of the  pedicle.  There were some spurs overhanging just inferior to the pedicle and  this was decompressed.  After adequate decompression on the left side,  attention was turned to the right.  There was not as much facet hypertrophy  and overhang on the right side at the left.  Disk was inspected from both  sides with the operating microscope.  It was  firm, but not herniated.  There  were large lateral veins in the lateral gutter, but they were undisturbed  and did not need to be coagulated.  Identical procedure was repeated at 3-4  level, less severe stenosis at this level.  Foraminal overhang of bones were  removed.  Thick chunks of ligament were removed until epidural fat was  visualized.  X-rays were taken, two other pictures during the case, and one  finally.  At the end with hockey stick distal out the foramina and #4  Penfield at the top, which was just above the 2-3 disk level. Dura was  intact after irrigation with saline solution, deep fascia closed with 0  Vicryl and 2-0 subcutaneous tissues.  Skin staple closure.  Marcaine  infiltration.  Postoperative dressing.  Transferred to recovery room in  stable condition.   ADDENDUM:  The patient had a Foley placed at the beginning of the case.  She  was placed on chest rolls during the case due to the fact that a left total  knee arthroplasty was arthrofibrotic.  As the knee was  being figure-4'd,  there was some crepitus.  It was flexed from 70 on up to 90 degrees with  some crepitus for positioning for Foley placement.      Mark C. Ophelia Charter, M.D.  Electronically Signed     MCY/MEDQ  D:  01/13/2006  T:  01/14/2006  Job:  [id number]

## 2011-03-04 ENCOUNTER — Other Ambulatory Visit: Payer: Self-pay | Admitting: Internal Medicine

## 2011-03-04 DIAGNOSIS — Z1231 Encounter for screening mammogram for malignant neoplasm of breast: Secondary | ICD-10-CM

## 2011-04-16 ENCOUNTER — Ambulatory Visit: Payer: Self-pay

## 2011-05-10 ENCOUNTER — Ambulatory Visit
Admission: RE | Admit: 2011-05-10 | Discharge: 2011-05-10 | Disposition: A | Discharge status: Home / Self Care | Payer: Medicare Other | Source: Ambulatory Visit | Attending: Internal Medicine | Admitting: Internal Medicine

## 2011-05-10 DIAGNOSIS — Z1231 Encounter for screening mammogram for malignant neoplasm of breast: Secondary | ICD-10-CM

## 2012-04-22 ENCOUNTER — Other Ambulatory Visit: Payer: Self-pay | Admitting: Internal Medicine

## 2012-04-22 DIAGNOSIS — Z1231 Encounter for screening mammogram for malignant neoplasm of breast: Secondary | ICD-10-CM

## 2012-05-13 ENCOUNTER — Ambulatory Visit
Admission: RE | Admit: 2012-05-13 | Discharge: 2012-05-13 | Disposition: A | Discharge status: Home / Self Care | Payer: Medicare Other | Source: Ambulatory Visit | Attending: Internal Medicine | Admitting: Internal Medicine

## 2012-05-13 DIAGNOSIS — Z1231 Encounter for screening mammogram for malignant neoplasm of breast: Secondary | ICD-10-CM

## 2013-04-13 ENCOUNTER — Other Ambulatory Visit: Payer: Self-pay

## 2013-04-13 DIAGNOSIS — Z1231 Encounter for screening mammogram for malignant neoplasm of breast: Secondary | ICD-10-CM

## 2013-05-14 ENCOUNTER — Ambulatory Visit
Admission: RE | Admit: 2013-05-14 | Discharge: 2013-05-14 | Disposition: A | Discharge status: Home / Self Care | Payer: Medicare Other | Source: Ambulatory Visit

## 2013-05-14 DIAGNOSIS — Z1231 Encounter for screening mammogram for malignant neoplasm of breast: Secondary | ICD-10-CM

## 2014-04-20 ENCOUNTER — Other Ambulatory Visit: Payer: Self-pay

## 2014-04-20 DIAGNOSIS — Z1231 Encounter for screening mammogram for malignant neoplasm of breast: Secondary | ICD-10-CM

## 2014-05-18 ENCOUNTER — Ambulatory Visit
Admission: RE | Admit: 2014-05-18 | Discharge: 2014-05-18 | Disposition: A | Discharge status: Home / Self Care | Payer: Medicare Other | Source: Ambulatory Visit

## 2014-05-18 DIAGNOSIS — Z1231 Encounter for screening mammogram for malignant neoplasm of breast: Secondary | ICD-10-CM

## 2015-03-29 ENCOUNTER — Other Ambulatory Visit: Payer: Self-pay

## 2015-03-29 DIAGNOSIS — Z1231 Encounter for screening mammogram for malignant neoplasm of breast: Secondary | ICD-10-CM

## 2015-05-22 ENCOUNTER — Ambulatory Visit
Admission: RE | Admit: 2015-05-22 | Discharge: 2015-05-22 | Disposition: A | Discharge status: Home / Self Care | Payer: BC Managed Care – PPO | Source: Ambulatory Visit

## 2015-05-22 DIAGNOSIS — Z1231 Encounter for screening mammogram for malignant neoplasm of breast: Secondary | ICD-10-CM

## 2016-05-21 ENCOUNTER — Other Ambulatory Visit: Payer: Self-pay | Admitting: Geriatric Medicine

## 2016-05-21 DIAGNOSIS — Z1231 Encounter for screening mammogram for malignant neoplasm of breast: Secondary | ICD-10-CM

## 2016-05-28 DIAGNOSIS — Z029 Encounter for administrative examinations, unspecified: Secondary | ICD-10-CM

## 2016-06-13 ENCOUNTER — Ambulatory Visit: Payer: BC Managed Care – PPO

## 2016-06-26 ENCOUNTER — Telehealth (INDEPENDENT_AMBULATORY_CARE_PROVIDER_SITE_OTHER): Payer: Self-pay | Admitting: Orthopaedic Surgery

## 2016-06-27 ENCOUNTER — Ambulatory Visit
Admission: RE | Admit: 2016-06-27 | Discharge: 2016-06-27 | Disposition: A | Discharge status: Home / Self Care | Payer: BC Managed Care – PPO | Source: Ambulatory Visit | Attending: Geriatric Medicine | Admitting: Geriatric Medicine

## 2016-06-27 DIAGNOSIS — Z1231 Encounter for screening mammogram for malignant neoplasm of breast: Secondary | ICD-10-CM

## 2016-07-01 ENCOUNTER — Telehealth (INDEPENDENT_AMBULATORY_CARE_PROVIDER_SITE_OTHER): Payer: Self-pay | Admitting: Orthopedic Surgery

## 2016-07-01 NOTE — Telephone Encounter (Signed)
Ms. [name] is calling to check on the status of her disability forms.  She dropped them off on 05/28/2016.  States she spoke with someone last week who said they would be mailed. She has not received anything.  Please call to discuss.

## 2016-07-08 NOTE — Telephone Encounter (Signed)
PT. Called back about papers that she stated she dropped off and paid for on 10/17.

## 2016-07-08 NOTE — Telephone Encounter (Signed)
She states the referance number is [id number]. Pt is upset she has not received this yet

## 2016-07-09 ENCOUNTER — Telehealth (INDEPENDENT_AMBULATORY_CARE_PROVIDER_SITE_OTHER): Payer: Self-pay | Admitting: Orthopaedic Surgery

## 2016-07-09 NOTE — Telephone Encounter (Signed)
Taken care of

## 2016-07-09 NOTE — Telephone Encounter (Signed)
Patient came in and spoke with [name]. I had spoken to her on a few occasions and advised I did not have anything for Dr. Ophelia CharterYates to sign.

## 2016-07-09 NOTE — Telephone Encounter (Signed)
Patient spoke with Toniann FailWendy. I have tried to explain to her that the paperwork that I had is disability or FMLA paperwork and that Dr. Ophelia CharterYates did not take her out of work. She retired. Toniann FailWendy spoke in detail with the patient.

## 2016-11-12 ENCOUNTER — Ambulatory Visit (INDEPENDENT_AMBULATORY_CARE_PROVIDER_SITE_OTHER): Payer: Self-pay | Admitting: Orthopaedic Surgery

## 2016-11-27 ENCOUNTER — Ambulatory Visit (INDEPENDENT_AMBULATORY_CARE_PROVIDER_SITE_OTHER): Payer: Self-pay | Admitting: Orthopaedic Surgery

## 2017-10-08 ENCOUNTER — Ambulatory Visit (INDEPENDENT_AMBULATORY_CARE_PROVIDER_SITE_OTHER): Payer: Medicare Other | Admitting: Orthopaedic Surgery

## 2017-10-08 VITALS — BP 191/82 | HR 96

## 2017-10-08 DIAGNOSIS — T8482XS Fibrosis due to internal orthopedic prosthetic devices, implants and grafts, sequela: Secondary | ICD-10-CM | POA: Diagnosis not present

## 2017-10-08 NOTE — Progress Notes (Signed)
   Office Visit Note   Patient: Kathryn CoveDorothy Pierotti           Date of Birth: 11-23-1925           MRN: [id number] Visit Date: 10/08/2017              Requested by: No referring provider defined for this encounter. PCP: Patient, No Pcp Per   Assessment & Plan: Visit Diagnoses:  1. Arthrofibrosis of total knee replacement, sequela     Plan: Patient has been through total knee arthroplasty.  She is had manipulation x2 and had the opposite knee manipulated when the other total knee arthroplasty was done and despite all this is in his formal therapy is stopped she develops arthrofibrosis with loss of flexion.  She has difficulty tolerating therapy.  She can continue to work on some knee flexion on her own at home.  Follow-up as needed.  She has been through several rounds of physical therapy without improvement in knee flexion.  Follow-Up Instructions: Return if symptoms worsen or fail to improve.   Orders:  No orders of the defined types were placed in this encounter.  No orders of the defined types were placed in this encounter.     Procedures: No procedures performed   Clinical Data: No additional findings.   Subjective: Chief Complaint  Patient presents with  . Lower Back - Pain  . Right Knee - Pain  . Left Knee - Pain    HPI patient returns both post bilateral total knee arthroplasties.  She had problems and was not able to get flexion with either knee.  She has 20-45 degrees range of motion of her knees and has great difficulty getting up out of a chair at home.  She still active and is driving at age 84.  Patient has bilateral knee arthrofibrosis.  Review of Systems updated and unchanged from last office visit.   Objective: Vital Signs: BP (!) 191/82   Pulse 96   Physical Exam  Constitutional: She is oriented to person, place, and time. She appears well-developed.  HENT:  Head: Normocephalic.  Right Ear: External ear normal.  Left Ear: External ear normal.  Eyes:  Pupils are equal, round, and reactive to light.  Neck: No tracheal deviation present. No thyromegaly present.  Cardiovascular: Normal rate.  Pulmonary/Chest: Effort normal.  Abdominal: Soft.  Neurological: She is alert and oriented to person, place, and time.  Skin: Skin is warm and dry.  Psychiatric: She has a normal mood and affect. Her behavior is normal.    Ortho Exam patient has bilateral knee arthrofibrosis with only about 45-50 degree knee flexion bilaterally.  She lacks 10 degrees to 15 degrees with extension passively.  Incisions are well-healed collateral ligaments are stable.  Specialty Comments:  No specialty comments available.  Imaging: No results found.   PMFS History: There are no active problems to display for this patient.  History reviewed. No pertinent past medical history.  History reviewed. No pertinent family history.  History reviewed. No pertinent surgical history. Social History   Occupational History  . Not on file  Tobacco Use  . Smoking status: Not on file  Substance and Sexual Activity  . Alcohol use: Not on file  . Drug use: Not on file  . Sexual activity: Not on file

## 2017-10-23 ENCOUNTER — Encounter (INDEPENDENT_AMBULATORY_CARE_PROVIDER_SITE_OTHER): Payer: Self-pay | Admitting: Orthopaedic Surgery

## 2018-09-30 ENCOUNTER — Ambulatory Visit (INDEPENDENT_AMBULATORY_CARE_PROVIDER_SITE_OTHER): Payer: Self-pay

## 2018-09-30 ENCOUNTER — Encounter (INDEPENDENT_AMBULATORY_CARE_PROVIDER_SITE_OTHER): Payer: Self-pay | Admitting: Orthopaedic Surgery

## 2018-09-30 ENCOUNTER — Ambulatory Visit (INDEPENDENT_AMBULATORY_CARE_PROVIDER_SITE_OTHER): Payer: Medicare Other | Admitting: Orthopaedic Surgery

## 2018-09-30 VITALS — Ht 67.0 in | Wt 145.0 lb

## 2018-09-30 DIAGNOSIS — M79671 Pain in right foot: Secondary | ICD-10-CM

## 2018-09-30 NOTE — Progress Notes (Signed)
   Office Visit Note   Patient: [name]           Date of Birth: 1926/06/05           MRN: [id number] Visit Date: 09/30/2018              Requested by: Merlene Laughter, MD [address] [place], [place] [address] PCP: Patient, No Pcp Per   Assessment & Plan: Visit Diagnoses:  1. Pain in right foot     Plan: We discussed callus care.  Silicone toe spacer given.  This gave her improvement in pain with shoe wearing.  Follow-up PRN.  Follow-Up Instructions: No follow-ups on file.   Orders:  Orders Placed This Encounter  Procedures  . XR Foot Complete Right   No orders of the defined types were placed in this encounter.     Procedures: No procedures performed   Clinical Data: No additional findings.   Subjective: Chief Complaint  Patient presents with  . Right Foot - Pain    HPI 85 year old female returns with bilateral knee arthrofibrosis.  She had total knee arthroplasties and only kept her knee motion going as long as she went to therapy.  She had manipulation under anesthesia with arthroscopic partial synovectomy and then had recurrent arthrofibrosis when she would go weeks without flexing or extending her knees.  She is here concerning right foot pain with callus over second toe.  She has hallux valgus metatarsus abductus with prominence over the proximal phalanx medial base.  No cellulitis no rash.  Review of Systems reviewed updated previous total knee arthroplasties with problems with arthrofibrosis that recurred with stopping  formal therapy.   Objective: Vital Signs: Ht 5\' 7"  (1.702 m)   Wt 145 lb (65.8 kg)   BMI 22.71 kg/m   Physical Exam Constitutional:      Appearance: She is well-developed.  HENT:     Head: Normocephalic.     Right Ear: External ear normal.     Left Ear: External ear normal.  Eyes:     Pupils: Pupils are equal, round, and reactive to light.  Neck:     Thyroid: No thyromegaly.     Trachea: No tracheal  deviation.  Cardiovascular:     Rate and Rhythm: Normal rate.  Pulmonary:     Effort: Pulmonary effort is normal.  Abdominal:     Palpations: Abdomen is soft.  Skin:    General: Skin is warm and dry.  Neurological:     Mental Status: She is alert and oriented to person, place, and time.  Psychiatric:        Behavior: Behavior normal.     Ortho Exam bilateral knee arthrofibrosis.  Painful callus over the second medial toe base.  Hallux valgus metatarsus abductus without hallux rigidus.  Pulses are palpable.  Specialty Comments:  No specialty comments available.  Imaging: No results found.   PMFS History: There are no active problems to display for this patient.  No past medical history on file.  No family history on file.  No past surgical history on file. Social History   Occupational History  . Not on file  Tobacco Use  . Smoking status: Never Smoker  . Smokeless tobacco: Never Used  Substance and Sexual Activity  . Alcohol use: Not on file  . Drug use: Not on file  . Sexual activity: Not on file

## 2019-04-30 ENCOUNTER — Telehealth: Payer: Self-pay | Admitting: Orthopaedic Surgery

## 2019-04-30 NOTE — Telephone Encounter (Signed)
Patient called and stated she need to renew her handicap sticker the date has ran out.  Please call patient [phone]

## 2019-05-03 NOTE — Telephone Encounter (Signed)
Ok for new placard? For how long? 

## 2019-05-03 NOTE — Telephone Encounter (Signed)
Holding for Dr. Lorin Mercy to sign. Patient will pick up in the office tomorrow sometime after 9am.

## 2019-05-03 NOTE — Telephone Encounter (Signed)
Ok times one year thanks

## 2019-07-13 ENCOUNTER — Ambulatory Visit: Payer: Self-pay | Admitting: Orthopaedic Surgery

## 2019-10-14 ENCOUNTER — Ambulatory Visit: Payer: Self-pay | Attending: Internal Medicine

## 2020-01-17 DIAGNOSIS — N1831 Chronic kidney disease, stage 3a: Secondary | ICD-10-CM | POA: Diagnosis not present

## 2020-01-17 DIAGNOSIS — I129 Hypertensive chronic kidney disease with stage 1 through stage 4 chronic kidney disease, or unspecified chronic kidney disease: Secondary | ICD-10-CM | POA: Diagnosis not present

## 2020-02-08 DIAGNOSIS — I129 Hypertensive chronic kidney disease with stage 1 through stage 4 chronic kidney disease, or unspecified chronic kidney disease: Secondary | ICD-10-CM | POA: Diagnosis not present

## 2020-02-08 DIAGNOSIS — F411 Generalized anxiety disorder: Secondary | ICD-10-CM | POA: Diagnosis not present

## 2020-02-08 DIAGNOSIS — N1831 Chronic kidney disease, stage 3a: Secondary | ICD-10-CM | POA: Diagnosis not present

## 2020-04-10 DIAGNOSIS — N1831 Chronic kidney disease, stage 3a: Secondary | ICD-10-CM | POA: Diagnosis not present

## 2020-04-10 DIAGNOSIS — I129 Hypertensive chronic kidney disease with stage 1 through stage 4 chronic kidney disease, or unspecified chronic kidney disease: Secondary | ICD-10-CM | POA: Diagnosis not present

## 2020-04-10 DIAGNOSIS — F411 Generalized anxiety disorder: Secondary | ICD-10-CM | POA: Diagnosis not present

## 2020-07-18 DIAGNOSIS — Z79899 Other long term (current) drug therapy: Secondary | ICD-10-CM | POA: Diagnosis not present

## 2020-07-18 DIAGNOSIS — N1831 Chronic kidney disease, stage 3a: Secondary | ICD-10-CM | POA: Diagnosis not present

## 2020-07-18 DIAGNOSIS — I129 Hypertensive chronic kidney disease with stage 1 through stage 4 chronic kidney disease, or unspecified chronic kidney disease: Secondary | ICD-10-CM | POA: Diagnosis not present

## 2020-09-05 DIAGNOSIS — N1831 Chronic kidney disease, stage 3a: Secondary | ICD-10-CM | POA: Diagnosis not present

## 2020-09-05 DIAGNOSIS — I129 Hypertensive chronic kidney disease with stage 1 through stage 4 chronic kidney disease, or unspecified chronic kidney disease: Secondary | ICD-10-CM | POA: Diagnosis not present

## 2020-09-12 DIAGNOSIS — N1831 Chronic kidney disease, stage 3a: Secondary | ICD-10-CM | POA: Diagnosis not present

## 2020-09-12 DIAGNOSIS — I129 Hypertensive chronic kidney disease with stage 1 through stage 4 chronic kidney disease, or unspecified chronic kidney disease: Secondary | ICD-10-CM | POA: Diagnosis not present

## 2020-11-17 DIAGNOSIS — G301 Alzheimer's disease with late onset: Secondary | ICD-10-CM | POA: Diagnosis not present

## 2020-11-17 DIAGNOSIS — N1831 Chronic kidney disease, stage 3a: Secondary | ICD-10-CM | POA: Diagnosis not present

## 2020-11-17 DIAGNOSIS — I129 Hypertensive chronic kidney disease with stage 1 through stage 4 chronic kidney disease, or unspecified chronic kidney disease: Secondary | ICD-10-CM | POA: Diagnosis not present

## 2020-12-08 DIAGNOSIS — R634 Abnormal weight loss: Secondary | ICD-10-CM | POA: Diagnosis not present

## 2020-12-08 DIAGNOSIS — Z79899 Other long term (current) drug therapy: Secondary | ICD-10-CM | POA: Diagnosis not present

## 2020-12-08 DIAGNOSIS — F0280 Dementia in other diseases classified elsewhere without behavioral disturbance: Secondary | ICD-10-CM | POA: Diagnosis not present

## 2020-12-08 DIAGNOSIS — I129 Hypertensive chronic kidney disease with stage 1 through stage 4 chronic kidney disease, or unspecified chronic kidney disease: Secondary | ICD-10-CM | POA: Diagnosis not present

## 2020-12-08 DIAGNOSIS — G301 Alzheimer's disease with late onset: Secondary | ICD-10-CM | POA: Diagnosis not present

## 2020-12-08 DIAGNOSIS — N1831 Chronic kidney disease, stage 3a: Secondary | ICD-10-CM | POA: Diagnosis not present

## 2021-01-03 DIAGNOSIS — I129 Hypertensive chronic kidney disease with stage 1 through stage 4 chronic kidney disease, or unspecified chronic kidney disease: Secondary | ICD-10-CM | POA: Diagnosis not present

## 2021-01-03 DIAGNOSIS — N1831 Chronic kidney disease, stage 3a: Secondary | ICD-10-CM | POA: Diagnosis not present

## 2021-01-03 DIAGNOSIS — G301 Alzheimer's disease with late onset: Secondary | ICD-10-CM | POA: Diagnosis not present

## 2021-01-11 ENCOUNTER — Ambulatory Visit
Admission: EM | Admit: 2021-01-11 | Discharge: 2021-01-11 | Disposition: A | Discharge status: Home / Self Care | Payer: Medicare PPO | Attending: Emergency Medicine | Admitting: Emergency Medicine

## 2021-01-11 ENCOUNTER — Ambulatory Visit (INDEPENDENT_AMBULATORY_CARE_PROVIDER_SITE_OTHER): Payer: Medicare PPO

## 2021-01-11 DIAGNOSIS — M79642 Pain in left hand: Secondary | ICD-10-CM

## 2021-01-11 HISTORY — DX: Essential (primary) hypertension: I10

## 2021-01-11 MED ORDER — NAPROXEN 375 MG PO TABS: 375.0000 mg | ORAL_TABLET | Freq: Two times a day (BID) | ORAL | 0 refills | Status: DC

## 2021-01-11 MED ORDER — CEPHALEXIN 500 MG PO CAPS: 500.0000 mg | ORAL_CAPSULE | Freq: Four times a day (QID) | ORAL | 0 refills | Status: AC

## 2021-01-11 NOTE — ED Provider Notes (Signed)
[facility]    CSN: [id number] Arrival date & time: 01/11/21  1103      History   Chief Complaint Chief Complaint  Patient presents with  . Hand Pain    HPI [name] is a 87 y.o. female history of hypertension presenting today for evaluation of left hand redness and swelling.  Symptoms have been present for the past 3 days.  Reports a lot of pain with weightbearing through hand.  Denies specific fall, but does recall possibly hitting hand against wall.  Denies history of gout.  Reports pain relatively mild.  Denies fevers. HPI  Past Medical History:  Diagnosis Date  . Hypertension     There are no problems to display for this patient.   History reviewed. No pertinent surgical history.  OB History   No obstetric history on file.      Home Medications    Prior to Admission medications   Medication Sig Start Date End Date Taking? Authorizing Provider  cephALEXin (KEFLEX) 500 MG capsule Take 1 capsule (500 mg total) by mouth 4 (four) times daily for 5 days. 01/11/21 01/16/21 Yes Contrina Orona C, PA-C  naproxen (NAPROSYN) 375 MG tablet Take 1 tablet (375 mg total) by mouth 2 (two) times daily. 01/11/21  Yes Mykeisha Dysert C, PA-C  ALPRAZolam (XANAX) 0.25 MG tablet Take 0.25 mg by mouth 2 (two) times daily as needed. 09/12/17   [provider]  amLODipine (NORVASC) 10 MG tablet Take 10 mg by mouth daily. 09/11/17   [provider]  valsartan-hydrochlorothiazide (DIOVAN-HCT) 320-25 MG tablet Take 1 tablet by mouth daily. 07/17/17   [provider]    Family History History reviewed. No pertinent family history.  Social History Social History   Tobacco Use  . Smoking status: Never Smoker  . Smokeless tobacco: Never Used  Substance Use Topics  . Alcohol use: Not Currently  . Drug use: Never     Allergies   Patient has no known allergies.   Review of Systems Review of Systems  Constitutional: Negative for fatigue and  fever.  HENT: Negative for mouth sores.   Eyes: Negative for visual disturbance.  Respiratory: Negative for shortness of breath.   Cardiovascular: Negative for chest pain.  Gastrointestinal: Negative for abdominal pain, nausea and vomiting.  Musculoskeletal: Positive for arthralgias and joint swelling.  Skin: Positive for color change. Negative for rash and wound.  Neurological: Negative for dizziness, weakness, light-headedness and headaches.     Physical Exam Triage Vital Signs ED Triage Vitals  Enc Vitals Group     BP 01/11/21 1223 (!) 217/96     Pulse Rate 01/11/21 1209 (!) 104     Resp 01/11/21 1209 18     Temp 01/11/21 1209 98.4 F (36.9 C)     Temp Source 01/11/21 1209 Oral     SpO2 01/11/21 1209 98 %     Weight --      Height --      Head Circumference --      Peak Flow --      Pain Score 01/11/21 1210 7     Pain Loc --      Pain Edu? --      Excl. in GC? --    No data found.  Updated Vital Signs BP (!) 217/96   Pulse (!) 104   Temp 98.4 F (36.9 C) (Oral)   Resp 18   SpO2 98%   Visual Acuity Right Eye Distance:  Left Eye Distance:   Bilateral Distance:    Right Eye Near:   Left Eye Near:    Bilateral Near:     Physical Exam Vitals and nursing note reviewed.  Constitutional:      Appearance: She is well-developed.     Comments: No acute distress  HENT:     Head: Normocephalic and atraumatic.     Nose: Nose normal.  Eyes:     Conjunctiva/sclera: Conjunctivae normal.  Cardiovascular:     Rate and Rhythm: Normal rate.  Pulmonary:     Effort: Pulmonary effort is normal. No respiratory distress.  Abdominal:     General: There is no distension.  Musculoskeletal:        General: Normal range of motion.     Cervical back: Neck supple.     Comments: Left hand with diffuse swelling and erythema throughout dorsum of hand, erythematous and extending slightly into wrist, no significant tenderness to palpation throughout distal radius ulna or first  through fifth metacarpals, full active range of motion, sensation intact distally to all 5 fingers on palmar and dorsal surface  Skin:    General: Skin is warm and dry.  Neurological:     Mental Status: She is alert and oriented to person, place, and time.      UC Treatments / Results  Labs (all labs ordered are listed, but only abnormal results are displayed) Labs Reviewed - No data to display  EKG   Radiology DG Hand Complete Left  Result Date: 01/11/2021 CLINICAL DATA:  Left hand pain and swelling after hitting it against the kitchen door 2 days ago. EXAM: LEFT HAND - COMPLETE 3+ VIEW COMPARISON:  None. FINDINGS: No acute fracture or dislocation. Scattered mild osteoarthritis. Dorsal hand soft tissue swelling. IMPRESSION: 1. Dorsal hand soft tissue swelling. No acute osseous abnormality. Electronically Signed   By: Obie Dredge M.D.   On: 01/11/2021 13:16    Procedures Procedures (including critical care time)  Medications Ordered in UC Medications - No data to display  Initial Impression / Assessment and Plan / UC Course  I have reviewed the triage vital signs and the nursing notes.  Pertinent labs & imaging results that were available during my care of the patient were reviewed by me and considered in my medical decision making (see chart for details).     X-ray negative for acute fracture, given associated redness concerning for possible cellulitis, no obvious wound or source of entry, possible gout.  Opting to place on Keflex and Naprosyn, Ace wrap applied for compression and extra support ice and elevate with close monitoring.  Neurovascularly intact.  Discussed strict return precautions. Patient verbalized understanding and is agreeable with plan.  Final Clinical Impressions(s) / UC Diagnoses   Final diagnoses:  Left hand pain     Discharge Instructions     Begin keflex 4 times daily for 5 days Naprosyn twice daily with food Ice and elevate ACE wrap for  compression/comfort    ED Prescriptions    Medication Sig Dispense Auth. Provider   cephALEXin (KEFLEX) 500 MG capsule Take 1 capsule (500 mg total) by mouth 4 (four) times daily for 5 days. 20 capsule Bentlee Benningfield C, PA-C   naproxen (NAPROSYN) 375 MG tablet Take 1 tablet (375 mg total) by mouth 2 (two) times daily. 20 tablet Nataya Bastedo, [place] C, PA-C     PDMP not reviewed this encounter.   Lew Dawes, [place] 01/11/21 1448

## 2021-01-11 NOTE — Discharge Instructions (Signed)
Begin keflex 4 times daily for 5 days Naprosyn twice daily with food Ice and elevate ACE wrap for compression/comfort

## 2021-01-11 NOTE — ED Triage Notes (Signed)
Pt presents today for a 3-4 day h/o left hand swelling and redness. Pt does not recall how she got the injury but thinks that she hit it against her bedroom wall. Swelling and redness has increased since the onset. No pain on movement, but notes slight sensitivity to touch and difficulty baring weight.

## 2021-01-29 ENCOUNTER — Ambulatory Visit
Admission: RE | Admit: 2021-01-29 | Discharge: 2021-01-29 | Disposition: A | Discharge status: Home / Self Care | Payer: Medicare PPO | Source: Ambulatory Visit | Attending: Geriatric Medicine | Admitting: Geriatric Medicine

## 2021-01-29 ENCOUNTER — Other Ambulatory Visit: Payer: Self-pay | Admitting: Geriatric Medicine

## 2021-01-29 DIAGNOSIS — M25512 Pain in left shoulder: Secondary | ICD-10-CM | POA: Diagnosis not present

## 2021-01-29 DIAGNOSIS — M19012 Primary osteoarthritis, left shoulder: Secondary | ICD-10-CM | POA: Diagnosis not present

## 2021-01-29 DIAGNOSIS — F411 Generalized anxiety disorder: Secondary | ICD-10-CM | POA: Diagnosis not present

## 2021-01-29 DIAGNOSIS — N1831 Chronic kidney disease, stage 3a: Secondary | ICD-10-CM | POA: Diagnosis not present

## 2021-01-29 DIAGNOSIS — I129 Hypertensive chronic kidney disease with stage 1 through stage 4 chronic kidney disease, or unspecified chronic kidney disease: Secondary | ICD-10-CM | POA: Diagnosis not present

## 2021-02-02 DIAGNOSIS — N1831 Chronic kidney disease, stage 3a: Secondary | ICD-10-CM | POA: Diagnosis not present

## 2021-02-02 DIAGNOSIS — I129 Hypertensive chronic kidney disease with stage 1 through stage 4 chronic kidney disease, or unspecified chronic kidney disease: Secondary | ICD-10-CM | POA: Diagnosis not present

## 2021-02-02 DIAGNOSIS — G301 Alzheimer's disease with late onset: Secondary | ICD-10-CM | POA: Diagnosis not present

## 2021-02-14 ENCOUNTER — Other Ambulatory Visit: Payer: Self-pay

## 2021-02-14 ENCOUNTER — Emergency Department (HOSPITAL_COMMUNITY)
Admission: EM | Admit: 2021-02-14 | Discharge: 2021-02-15 | Disposition: A | Discharge status: Home / Self Care | Payer: Medicare PPO | Attending: Emergency Medicine | Admitting: Emergency Medicine

## 2021-02-14 ENCOUNTER — Encounter (HOSPITAL_COMMUNITY): Payer: Self-pay

## 2021-02-14 DIAGNOSIS — Z79899 Other long term (current) drug therapy: Secondary | ICD-10-CM | POA: Diagnosis not present

## 2021-02-14 DIAGNOSIS — R739 Hyperglycemia, unspecified: Secondary | ICD-10-CM | POA: Diagnosis not present

## 2021-02-14 DIAGNOSIS — R000 Tachycardia, unspecified: Secondary | ICD-10-CM | POA: Diagnosis not present

## 2021-02-14 DIAGNOSIS — I10 Essential (primary) hypertension: Secondary | ICD-10-CM | POA: Insufficient documentation

## 2021-02-14 DIAGNOSIS — R609 Edema, unspecified: Secondary | ICD-10-CM | POA: Diagnosis not present

## 2021-02-14 DIAGNOSIS — R531 Weakness: Secondary | ICD-10-CM | POA: Diagnosis not present

## 2021-02-14 DIAGNOSIS — R0902 Hypoxemia: Secondary | ICD-10-CM | POA: Diagnosis not present

## 2021-02-14 LAB — URINALYSIS, ROUTINE W REFLEX MICROSCOPIC
Bacteria, UA: NONE SEEN
Bilirubin Urine: NEGATIVE
Glucose, UA: NEGATIVE mg/dL
Ketones, ur: 5 mg/dL — AB
Nitrite: NEGATIVE
Protein, ur: 100 mg/dL — AB
Specific Gravity, Urine: 1.021 (ref 1.005–1.030)
pH: 5 (ref 5.0–8.0)

## 2021-02-14 NOTE — ED Triage Notes (Signed)
Pt brought in by ems for poss uti. Pt lives at home by herself  and ambulates with a walker. Family reports that pt has been sitting in her recliner since yesterday and has been incontinent of urine. Per EMS, pt was weak on their arrival and was not able to ambulate. Pt with hx of HTN. Pt had discomfort when moving her r leg but denies any complaints. Aaox4.

## 2021-02-14 NOTE — ED Notes (Signed)
[facility] CALLED FOR PT TRANSPORT BACK HOME. THEY STATE THAT THERE ARE 5 PEOPLE IN FRONT OF PT. PT WILL BE MADE AWARE OF WAIT. NO FAMILY HAS COME OR CALLED TO ASK ABOUT PT.

## 2021-02-14 NOTE — ED Notes (Signed)
PT GETTING ANXIOUS DUE TO FAMILY NOT BEING IN THE BACK WITH HER. WENT TO CHECK TO SEE IF FAMILY WAS IN THE WR BUT NOBODY THERE. ATTEMPTED TO CALL PT'S EMERGENCY CONTACT BUT NO ANSWER. PT MADE AWARE.

## 2021-02-14 NOTE — ED Provider Notes (Signed)
[facility] Provider Note   CSN: [id number] Arrival date & time: 02/14/21  2116     History Chief Complaint  Patient presents with   Weakness    [name] is a 87 y.o. female.  87 year old female with history of hypertension who presents with weakness.  Patient lives at home alone and ambulates with a walker.  Family was sitting in her recliner this evening and initially had trouble getting up which she attributes to arthritis and old age.  Family was concerned about weakness.  Patient denies any weakness or any complaints at all and did not want to come to the hospital but came because her family insisted.  She reportedly had an episode of urinary incontinence.  She denies any urinary symptoms, abdominal pain, fevers, or recent illness.  EMS had noted some pain when moving her right leg and patient states that this is due to arthritis.  She denies any pain currently.  She wants to go home because she states nothing is wrong.  The history is provided by the patient.  Weakness     Past Medical History:  Diagnosis Date   Hypertension     There are no problems to display for this patient.   History reviewed. No pertinent surgical history.   OB History   No obstetric history on file.     History reviewed. No pertinent family history.  Social History   Tobacco Use   Smoking status: Never   Smokeless tobacco: Never  Substance Use Topics   Alcohol use: Not Currently   Drug use: Never    Home Medications Prior to Admission medications   Medication Sig Start Date End Date Taking? Authorizing Provider  ALPRAZolam (XANAX) 0.25 MG tablet Take 0.25 mg by mouth 2 (two) times daily as needed. 09/12/17   [provider]  amLODipine (NORVASC) 10 MG tablet Take 10 mg by mouth daily. 09/11/17   [provider]  naproxen (NAPROSYN) 375 MG tablet Take 1 tablet (375 mg total) by mouth 2 (two) times daily. 01/11/21   Wieters, Hallie  C, PA-C  valsartan-hydrochlorothiazide (DIOVAN-HCT) 320-25 MG tablet Take 1 tablet by mouth daily. 07/17/17   [provider]    Allergies    Patient has no known allergies.  Review of Systems   Review of Systems  Neurological:  Positive for weakness.  All other systems reviewed and are negative except that which was mentioned in HPI  Physical Exam Updated Vital Signs BP (!) 196/79   Pulse (!) 101   Temp 98.1 F (36.7 C) (Oral)   Resp 16   Ht 5\' 10"  (1.778 m)   Wt 58.5 kg   SpO2 98%   BMI 18.51 kg/m   Physical Exam Vitals and nursing note reviewed.  Constitutional:      General: She is not in acute distress.    Appearance: Normal appearance.  HENT:     Head: Normocephalic and atraumatic.  Eyes:     Conjunctiva/sclera: Conjunctivae normal.  Cardiovascular:     Rate and Rhythm: Normal rate and regular rhythm.     Heart sounds: Normal heart sounds. No murmur heard. Pulmonary:     Effort: Pulmonary effort is normal.     Breath sounds: Normal breath sounds.  Abdominal:     General: Abdomen is flat. Bowel sounds are normal. There is no distension.     Palpations: Abdomen is soft.     Tenderness: There is no abdominal tenderness.  Musculoskeletal:  General: Normal range of motion.     Right lower leg: No edema.     Left lower leg: No edema.     Comments: Normal ROM b/l hips, no joint swelling or tenderness  Skin:    General: Skin is warm and dry.  Neurological:     Mental Status: She is alert and oriented to person, place, and time.     Comments: fluent  Psychiatric:        Mood and Affect: Mood normal.        Behavior: Behavior normal.    ED Results / Procedures / Treatments   Labs (all labs ordered are listed, but only abnormal results are displayed) Labs Reviewed  URINALYSIS, ROUTINE W REFLEX MICROSCOPIC - Abnormal; Notable for the following components:      Result Value   Hgb urine dipstick MODERATE (*)    Ketones, ur 5 (*)    Protein, ur  100 (*)    Leukocytes,Ua TRACE (*)    All other components within normal limits  URINE CULTURE    EKG None  Radiology No results found.  Procedures Procedures   Medications Ordered in ED Medications - No data to display  ED Course  I have reviewed the triage vital signs and the nursing notes.  Pertinent labs & imaging results that were available during my care of the patient were reviewed by me and considered in my medical decision making (see chart for details).    MDM Rules/Calculators/A&P                          Patient has no complaints whatsoever on exam.  She is hypertensive but has no complaints to suggest hypertensive emergency.  She has requested to go home and does not feel she needs any work-up, is only here because her family wanted her to be seen.  Urinalysis shows no evidence of infection.  Family has not answered phone and is not present in the ED, will continue to attempt to contact them and will transport patient home via [facility]. Final Clinical Impression(s) / ED Diagnoses Final diagnoses:  Primary hypertension  Weakness    Rx / DC Orders ED Discharge Orders     None        Aksel Bencomo, Ambrose [place], MD 02/14/21 2341

## 2021-02-14 NOTE — ED Notes (Signed)
Pt changed into gown and purewick placed.

## 2021-02-15 DIAGNOSIS — I10 Essential (primary) hypertension: Secondary | ICD-10-CM | POA: Diagnosis not present

## 2021-02-15 DIAGNOSIS — Z743 Need for continuous supervision: Secondary | ICD-10-CM | POA: Diagnosis not present

## 2021-02-15 DIAGNOSIS — R0902 Hypoxemia: Secondary | ICD-10-CM | POA: Diagnosis not present

## 2021-02-15 DIAGNOSIS — R531 Weakness: Secondary | ICD-10-CM | POA: Diagnosis not present

## 2021-02-15 NOTE — ED Notes (Signed)
Both contact numbers has been called this morning with no one answering

## 2021-02-15 NOTE — ED Notes (Signed)
Patient appears to be sleeping.  Color appropriate.  No distress noted

## 2021-02-15 NOTE — ED Notes (Signed)
Family returned call and is aware that [facility] will be transporting the patient back to home and they will need to be present to have the house unlocked and with the patient

## 2021-02-15 NOTE — ED Notes (Signed)
Patient waiting on [facility] for transport to her home

## 2021-02-15 NOTE — ED Notes (Signed)
ptar came to pick up pt but unable to get a hold of pt's family to make sure someone will be home to open the door. Pt does not remember her kids number and nobody is answering the numbers on her facesheet. Ptar will transport another pt and then pt will try to see if she can remember a family's member number to call later.

## 2021-02-16 LAB — URINE CULTURE: Culture: 40000 — AB

## 2021-02-26 DIAGNOSIS — N1831 Chronic kidney disease, stage 3a: Secondary | ICD-10-CM | POA: Diagnosis not present

## 2021-02-26 DIAGNOSIS — I129 Hypertensive chronic kidney disease with stage 1 through stage 4 chronic kidney disease, or unspecified chronic kidney disease: Secondary | ICD-10-CM | POA: Diagnosis not present

## 2021-02-26 DIAGNOSIS — M1711 Unilateral primary osteoarthritis, right knee: Secondary | ICD-10-CM | POA: Diagnosis not present

## 2021-02-26 DIAGNOSIS — E213 Hyperparathyroidism, unspecified: Secondary | ICD-10-CM | POA: Diagnosis not present

## 2021-02-26 DIAGNOSIS — G301 Alzheimer's disease with late onset: Secondary | ICD-10-CM | POA: Diagnosis not present

## 2021-02-26 DIAGNOSIS — Z9181 History of falling: Secondary | ICD-10-CM | POA: Diagnosis not present

## 2021-02-26 DIAGNOSIS — F411 Generalized anxiety disorder: Secondary | ICD-10-CM | POA: Diagnosis not present

## 2021-02-26 DIAGNOSIS — R809 Proteinuria, unspecified: Secondary | ICD-10-CM | POA: Diagnosis not present

## 2021-02-26 DIAGNOSIS — F0280 Dementia in other diseases classified elsewhere without behavioral disturbance: Secondary | ICD-10-CM | POA: Diagnosis not present

## 2021-03-05 ENCOUNTER — Emergency Department (HOSPITAL_COMMUNITY): Payer: Medicare PPO

## 2021-03-05 ENCOUNTER — Inpatient Hospital Stay (HOSPITAL_COMMUNITY)
Admission: EM | Admit: 2021-03-05 | Discharge: 2021-03-12 | DRG: 871 | Disposition: A | Discharge status: Skilled Nursing Facility | Payer: Medicare PPO | Attending: Internal Medicine | Admitting: Internal Medicine

## 2021-03-05 ENCOUNTER — Other Ambulatory Visit: Payer: Self-pay

## 2021-03-05 ENCOUNTER — Encounter (HOSPITAL_COMMUNITY): Payer: Self-pay | Admitting: Emergency Medicine

## 2021-03-05 DIAGNOSIS — I10 Essential (primary) hypertension: Secondary | ICD-10-CM | POA: Diagnosis present

## 2021-03-05 DIAGNOSIS — G301 Alzheimer's disease with late onset: Secondary | ICD-10-CM | POA: Diagnosis not present

## 2021-03-05 DIAGNOSIS — M1711 Unilateral primary osteoarthritis, right knee: Secondary | ICD-10-CM | POA: Diagnosis not present

## 2021-03-05 DIAGNOSIS — L89626 Pressure-induced deep tissue damage of left heel: Secondary | ICD-10-CM | POA: Diagnosis present

## 2021-03-05 DIAGNOSIS — F411 Generalized anxiety disorder: Secondary | ICD-10-CM | POA: Diagnosis not present

## 2021-03-05 DIAGNOSIS — L03115 Cellulitis of right lower limb: Secondary | ICD-10-CM | POA: Diagnosis present

## 2021-03-05 DIAGNOSIS — L089 Local infection of the skin and subcutaneous tissue, unspecified: Secondary | ICD-10-CM | POA: Diagnosis not present

## 2021-03-05 DIAGNOSIS — Z23 Encounter for immunization: Secondary | ICD-10-CM | POA: Diagnosis not present

## 2021-03-05 DIAGNOSIS — M19071 Primary osteoarthritis, right ankle and foot: Secondary | ICD-10-CM | POA: Diagnosis not present

## 2021-03-05 DIAGNOSIS — R000 Tachycardia, unspecified: Secondary | ICD-10-CM | POA: Diagnosis not present

## 2021-03-05 DIAGNOSIS — E43 Unspecified severe protein-calorie malnutrition: Secondary | ICD-10-CM | POA: Diagnosis present

## 2021-03-05 DIAGNOSIS — R600 Localized edema: Secondary | ICD-10-CM | POA: Diagnosis not present

## 2021-03-05 DIAGNOSIS — Z681 Body mass index (BMI) 19 or less, adult: Secondary | ICD-10-CM | POA: Diagnosis not present

## 2021-03-05 DIAGNOSIS — A419 Sepsis, unspecified organism: Principal | ICD-10-CM | POA: Diagnosis present

## 2021-03-05 DIAGNOSIS — R609 Edema, unspecified: Secondary | ICD-10-CM | POA: Diagnosis not present

## 2021-03-05 DIAGNOSIS — R809 Proteinuria, unspecified: Secondary | ICD-10-CM | POA: Diagnosis not present

## 2021-03-05 DIAGNOSIS — F0390 Unspecified dementia without behavioral disturbance: Secondary | ICD-10-CM | POA: Diagnosis present

## 2021-03-05 DIAGNOSIS — Z9181 History of falling: Secondary | ICD-10-CM | POA: Diagnosis not present

## 2021-03-05 DIAGNOSIS — L89616 Pressure-induced deep tissue damage of right heel: Secondary | ICD-10-CM | POA: Diagnosis present

## 2021-03-05 DIAGNOSIS — R410 Disorientation, unspecified: Secondary | ICD-10-CM | POA: Diagnosis not present

## 2021-03-05 DIAGNOSIS — Z96653 Presence of artificial knee joint, bilateral: Secondary | ICD-10-CM | POA: Diagnosis present

## 2021-03-05 DIAGNOSIS — M609 Myositis, unspecified: Secondary | ICD-10-CM | POA: Diagnosis present

## 2021-03-05 DIAGNOSIS — Z743 Need for continuous supervision: Secondary | ICD-10-CM | POA: Diagnosis not present

## 2021-03-05 DIAGNOSIS — Z87891 Personal history of nicotine dependence: Secondary | ICD-10-CM

## 2021-03-05 DIAGNOSIS — M7989 Other specified soft tissue disorders: Secondary | ICD-10-CM | POA: Diagnosis not present

## 2021-03-05 DIAGNOSIS — Z90710 Acquired absence of both cervix and uterus: Secondary | ICD-10-CM | POA: Diagnosis not present

## 2021-03-05 DIAGNOSIS — L97912 Non-pressure chronic ulcer of unspecified part of right lower leg with fat layer exposed: Secondary | ICD-10-CM | POA: Diagnosis present

## 2021-03-05 DIAGNOSIS — I129 Hypertensive chronic kidney disease with stage 1 through stage 4 chronic kidney disease, or unspecified chronic kidney disease: Secondary | ICD-10-CM | POA: Diagnosis present

## 2021-03-05 DIAGNOSIS — N1831 Chronic kidney disease, stage 3a: Secondary | ICD-10-CM | POA: Diagnosis present

## 2021-03-05 DIAGNOSIS — E213 Hyperparathyroidism, unspecified: Secondary | ICD-10-CM | POA: Diagnosis not present

## 2021-03-05 DIAGNOSIS — Z79899 Other long term (current) drug therapy: Secondary | ICD-10-CM | POA: Diagnosis not present

## 2021-03-05 DIAGNOSIS — M79671 Pain in right foot: Secondary | ICD-10-CM | POA: Diagnosis not present

## 2021-03-05 DIAGNOSIS — E876 Hypokalemia: Secondary | ICD-10-CM | POA: Diagnosis present

## 2021-03-05 DIAGNOSIS — F0280 Dementia in other diseases classified elsewhere without behavioral disturbance: Secondary | ICD-10-CM | POA: Diagnosis not present

## 2021-03-05 DIAGNOSIS — R739 Hyperglycemia, unspecified: Secondary | ICD-10-CM | POA: Diagnosis present

## 2021-03-05 DIAGNOSIS — Z20822 Contact with and (suspected) exposure to covid-19: Secondary | ICD-10-CM | POA: Diagnosis present

## 2021-03-05 DIAGNOSIS — L0390 Cellulitis, unspecified: Secondary | ICD-10-CM | POA: Diagnosis not present

## 2021-03-05 DIAGNOSIS — E46 Unspecified protein-calorie malnutrition: Secondary | ICD-10-CM

## 2021-03-05 DIAGNOSIS — Z66 Do not resuscitate: Secondary | ICD-10-CM | POA: Diagnosis present

## 2021-03-05 DIAGNOSIS — M2011 Hallux valgus (acquired), right foot: Secondary | ICD-10-CM | POA: Diagnosis not present

## 2021-03-05 DIAGNOSIS — E222 Syndrome of inappropriate secretion of antidiuretic hormone: Secondary | ICD-10-CM | POA: Diagnosis present

## 2021-03-05 DIAGNOSIS — R4182 Altered mental status, unspecified: Secondary | ICD-10-CM | POA: Diagnosis not present

## 2021-03-05 HISTORY — DX: Unspecified dementia, unspecified severity, without behavioral disturbance, psychotic disturbance, mood disturbance, and anxiety: F03.90

## 2021-03-05 LAB — CBC WITH DIFFERENTIAL/PLATELET
Abs Immature Granulocytes: 0.14 10*3/uL — ABNORMAL HIGH (ref 0.00–0.07)
Basophils Absolute: 0 10*3/uL (ref 0.0–0.1)
Basophils Relative: 0 %
Eosinophils Absolute: 0 10*3/uL (ref 0.0–0.5)
Eosinophils Relative: 0 %
HCT: 47.8 % — ABNORMAL HIGH (ref 36.0–46.0)
Hemoglobin: 14.8 g/dL (ref 12.0–15.0)
Immature Granulocytes: 1 %
Lymphocytes Relative: 4 %
Lymphs Abs: 0.7 10*3/uL (ref 0.7–4.0)
MCH: 27 pg (ref 26.0–34.0)
MCHC: 31 g/dL (ref 30.0–36.0)
MCV: 87.1 fL (ref 80.0–100.0)
Monocytes Absolute: 0.7 10*3/uL (ref 0.1–1.0)
Monocytes Relative: 4 %
Neutro Abs: 18.2 10*3/uL — ABNORMAL HIGH (ref 1.7–7.7)
Neutrophils Relative %: 91 %
Platelets: 445 10*3/uL — ABNORMAL HIGH (ref 150–400)
RBC: 5.49 MIL/uL — ABNORMAL HIGH (ref 3.87–5.11)
RDW: 13 % (ref 11.5–15.5)
WBC: 19.8 10*3/uL — ABNORMAL HIGH (ref 4.0–10.5)
nRBC: 0 % (ref 0.0–0.2)

## 2021-03-05 LAB — COMPREHENSIVE METABOLIC PANEL
ALT: 37 U/L (ref 0–44)
AST: 43 U/L — ABNORMAL HIGH (ref 15–41)
Albumin: 2.9 g/dL — ABNORMAL LOW (ref 3.5–5.0)
Alkaline Phosphatase: 81 U/L (ref 38–126)
Anion gap: 13 (ref 5–15)
BUN: 23 mg/dL (ref 8–23)
CO2: 30 mmol/L (ref 22–32)
Calcium: 9.7 mg/dL (ref 8.9–10.3)
Chloride: 96 mmol/L — ABNORMAL LOW (ref 98–111)
Creatinine, Ser: 0.95 mg/dL (ref 0.44–1.00)
GFR, Estimated: 56 mL/min — ABNORMAL LOW (ref >60)
Glucose, Bld: 183 mg/dL — ABNORMAL HIGH (ref 70–99)
Potassium: 3.2 mmol/L — ABNORMAL LOW (ref 3.5–5.1)
Sodium: 139 mmol/L (ref 135–145)
Total Bilirubin: 0.6 mg/dL (ref 0.3–1.2)
Total Protein: 6.9 g/dL (ref 6.5–8.1)

## 2021-03-05 LAB — LACTIC ACID, PLASMA: Lactic Acid, Venous: 3.3 mmol/L (ref 0.5–1.9)

## 2021-03-05 NOTE — ED Provider Notes (Signed)
Emergency Medicine Provider Triage Evaluation Note  [name] , a 87 y.o. female  was evaluated in triage.  Pt complains of left foot infection.  Review of Systems  Positive: Left foot infection, on heel, edema Negative: Fevers  Physical Exam  There were no vitals taken for this visit. Gen:   Awake, no distress   Resp:  Normal effort  MSK:   Left heel with infection, edema midfoot to mid shin.  Area is warm.  No streaking noted.  DP pulses by Doppler 2+  Medical Decision Making  Medically screening exam initiated at 4:07 PM.  Appropriate orders placed.  Jeffrie Stander was informed that the remainder of the evaluation will be completed by another provider, this initial triage assessment does not replace that evaluation, and the importance of remaining in the ED until their evaluation is complete.     Farrel Gordon, PA-C 03/05/21 1638    Virgina Norfolk, DO 03/05/21 2325

## 2021-03-05 NOTE — ED Triage Notes (Signed)
Pt BIB [facility] from home, c/o wound to right heel/ankle. Pain and swelling noted. Denies known fevers.

## 2021-03-06 ENCOUNTER — Encounter: Payer: Self-pay | Admitting: Internal Medicine

## 2021-03-06 ENCOUNTER — Encounter (HOSPITAL_COMMUNITY): Payer: Medicare PPO

## 2021-03-06 ENCOUNTER — Encounter (HOSPITAL_COMMUNITY): Payer: Self-pay | Admitting: Internal Medicine

## 2021-03-06 ENCOUNTER — Inpatient Hospital Stay (HOSPITAL_COMMUNITY): Payer: Medicare PPO

## 2021-03-06 DIAGNOSIS — Z87891 Personal history of nicotine dependence: Secondary | ICD-10-CM | POA: Diagnosis not present

## 2021-03-06 DIAGNOSIS — I129 Hypertensive chronic kidney disease with stage 1 through stage 4 chronic kidney disease, or unspecified chronic kidney disease: Secondary | ICD-10-CM | POA: Diagnosis present

## 2021-03-06 DIAGNOSIS — N1831 Chronic kidney disease, stage 3a: Secondary | ICD-10-CM | POA: Diagnosis present

## 2021-03-06 DIAGNOSIS — M609 Myositis, unspecified: Secondary | ICD-10-CM | POA: Diagnosis present

## 2021-03-06 DIAGNOSIS — A419 Sepsis, unspecified organism: Secondary | ICD-10-CM | POA: Diagnosis present

## 2021-03-06 DIAGNOSIS — F0390 Unspecified dementia without behavioral disturbance: Secondary | ICD-10-CM | POA: Diagnosis present

## 2021-03-06 DIAGNOSIS — Z96653 Presence of artificial knee joint, bilateral: Secondary | ICD-10-CM | POA: Diagnosis present

## 2021-03-06 DIAGNOSIS — I10 Essential (primary) hypertension: Secondary | ICD-10-CM | POA: Diagnosis not present

## 2021-03-06 DIAGNOSIS — Z66 Do not resuscitate: Secondary | ICD-10-CM | POA: Diagnosis present

## 2021-03-06 DIAGNOSIS — L97912 Non-pressure chronic ulcer of unspecified part of right lower leg with fat layer exposed: Secondary | ICD-10-CM | POA: Diagnosis not present

## 2021-03-06 DIAGNOSIS — E876 Hypokalemia: Secondary | ICD-10-CM | POA: Diagnosis present

## 2021-03-06 DIAGNOSIS — Z20822 Contact with and (suspected) exposure to covid-19: Secondary | ICD-10-CM | POA: Diagnosis present

## 2021-03-06 DIAGNOSIS — E222 Syndrome of inappropriate secretion of antidiuretic hormone: Secondary | ICD-10-CM | POA: Diagnosis present

## 2021-03-06 DIAGNOSIS — L89616 Pressure-induced deep tissue damage of right heel: Secondary | ICD-10-CM | POA: Diagnosis present

## 2021-03-06 DIAGNOSIS — L89626 Pressure-induced deep tissue damage of left heel: Secondary | ICD-10-CM | POA: Diagnosis present

## 2021-03-06 DIAGNOSIS — E46 Unspecified protein-calorie malnutrition: Secondary | ICD-10-CM

## 2021-03-06 DIAGNOSIS — Z681 Body mass index (BMI) 19 or less, adult: Secondary | ICD-10-CM | POA: Diagnosis not present

## 2021-03-06 DIAGNOSIS — Z90710 Acquired absence of both cervix and uterus: Secondary | ICD-10-CM | POA: Diagnosis not present

## 2021-03-06 DIAGNOSIS — R739 Hyperglycemia, unspecified: Secondary | ICD-10-CM | POA: Diagnosis present

## 2021-03-06 DIAGNOSIS — L0390 Cellulitis, unspecified: Secondary | ICD-10-CM | POA: Diagnosis not present

## 2021-03-06 DIAGNOSIS — Z79899 Other long term (current) drug therapy: Secondary | ICD-10-CM | POA: Diagnosis not present

## 2021-03-06 DIAGNOSIS — Z23 Encounter for immunization: Secondary | ICD-10-CM | POA: Diagnosis not present

## 2021-03-06 DIAGNOSIS — E43 Unspecified severe protein-calorie malnutrition: Secondary | ICD-10-CM | POA: Diagnosis present

## 2021-03-06 DIAGNOSIS — L03115 Cellulitis of right lower limb: Secondary | ICD-10-CM | POA: Diagnosis present

## 2021-03-06 HISTORY — DX: Chronic kidney disease, stage 3a: N18.31

## 2021-03-06 HISTORY — DX: Do not resuscitate: Z66

## 2021-03-06 LAB — RESP PANEL BY RT-PCR (FLU A&B, COVID) ARPGX2
Influenza A by PCR: NEGATIVE
Influenza B by PCR: NEGATIVE
SARS Coronavirus 2 by RT PCR: NEGATIVE

## 2021-03-06 LAB — URINALYSIS, ROUTINE W REFLEX MICROSCOPIC
Bilirubin Urine: NEGATIVE
Glucose, UA: NEGATIVE mg/dL
Hgb urine dipstick: NEGATIVE
Ketones, ur: NEGATIVE mg/dL
Nitrite: NEGATIVE
Protein, ur: NEGATIVE mg/dL
Specific Gravity, Urine: 1.014 (ref 1.005–1.030)
pH: 6 (ref 5.0–8.0)

## 2021-03-06 LAB — SEDIMENTATION RATE: Sed Rate: 37 mm/hr — ABNORMAL HIGH (ref 0–22)

## 2021-03-06 LAB — PROCALCITONIN: Procalcitonin: 0.21 ng/mL

## 2021-03-06 LAB — LACTIC ACID, PLASMA: Lactic Acid, Venous: 1.7 mmol/L (ref 0.5–1.9)

## 2021-03-06 LAB — C-REACTIVE PROTEIN: CRP: 16.4 mg/dL — ABNORMAL HIGH (ref <1.0)

## 2021-03-06 LAB — PREALBUMIN: Prealbumin: 6.2 mg/dL — ABNORMAL LOW (ref 18–38)

## 2021-03-06 MED ORDER — LORAZEPAM 2 MG/ML IJ SOLN: 1.0000 mg | Freq: Once | INTRAMUSCULAR | Status: DC

## 2021-03-06 MED ORDER — HYDRALAZINE HCL 20 MG/ML IJ SOLN
5.0000 mg | INTRAMUSCULAR | Status: DC | PRN
  Administered 2021-03-07: 5 mg via INTRAVENOUS
  Filled 2021-03-06: qty 1

## 2021-03-06 MED ORDER — VANCOMYCIN HCL IN DEXTROSE 1-5 GM/200ML-% IV SOLN
1000.0000 mg | INTRAVENOUS | Status: DC
  Administered 2021-03-07: 1000 mg via INTRAVENOUS
  Filled 2021-03-06: qty 200

## 2021-03-06 MED ORDER — POTASSIUM CHLORIDE CRYS ER 20 MEQ PO TBCR
40.0000 meq | EXTENDED_RELEASE_TABLET | Freq: Once | ORAL | Status: AC
  Administered 2021-03-06: 40 meq via ORAL
  Filled 2021-03-06: qty 2

## 2021-03-06 MED ORDER — METRONIDAZOLE 500 MG/100ML IV SOLN
500.0000 mg | Freq: Three times a day (TID) | INTRAVENOUS | Status: DC
  Administered 2021-03-06 – 2021-03-12 (×19): 500 mg via INTRAVENOUS
  Filled 2021-03-06 (×19): qty 100

## 2021-03-06 MED ORDER — ENOXAPARIN SODIUM 40 MG/0.4ML IJ SOSY
40.0000 mg | PREFILLED_SYRINGE | INTRAMUSCULAR | Status: DC
  Administered 2021-03-06 – 2021-03-12 (×7): 40 mg via SUBCUTANEOUS
  Filled 2021-03-06 (×7): qty 0.4

## 2021-03-06 MED ORDER — GADOBUTROL 1 MMOL/ML IV SOLN
5.0000 mL | Freq: Once | INTRAVENOUS | Status: AC | PRN
  Administered 2021-03-06: 5 mL via INTRAVENOUS

## 2021-03-06 MED ORDER — ACETAMINOPHEN 650 MG RE SUPP: 650.0000 mg | Freq: Four times a day (QID) | RECTAL | Status: DC | PRN

## 2021-03-06 MED ORDER — POLYETHYLENE GLYCOL 3350 17 G PO PACK
17.0000 g | PACK | Freq: Every day | ORAL | Status: DC | PRN
  Administered 2021-03-06 – 2021-03-08 (×2): 17 g via ORAL
  Filled 2021-03-06 (×2): qty 1

## 2021-03-06 MED ORDER — ACETAMINOPHEN 325 MG PO TABS
650.0000 mg | ORAL_TABLET | Freq: Four times a day (QID) | ORAL | Status: DC | PRN
  Administered 2021-03-12 (×2): 650 mg via ORAL
  Filled 2021-03-06 (×2): qty 2

## 2021-03-06 MED ORDER — DOCUSATE SODIUM 100 MG PO CAPS
100.0000 mg | ORAL_CAPSULE | Freq: Two times a day (BID) | ORAL | Status: DC
  Administered 2021-03-06 – 2021-03-12 (×11): 100 mg via ORAL
  Filled 2021-03-06 (×12): qty 1

## 2021-03-06 MED ORDER — MORPHINE SULFATE (PF) 2 MG/ML IV SOLN: 2.0000 mg | INTRAVENOUS | Status: DC | PRN

## 2021-03-06 MED ORDER — HYDROCODONE-ACETAMINOPHEN 5-325 MG PO TABS
1.0000 | ORAL_TABLET | ORAL | Status: DC | PRN
  Administered 2021-03-07 (×2): 1 via ORAL
  Administered 2021-03-07 – 2021-03-10 (×4): 2 via ORAL
  Administered 2021-03-10: 1 via ORAL
  Administered 2021-03-11 – 2021-03-12 (×4): 2 via ORAL
  Administered 2021-03-12: 1 via ORAL
  Filled 2021-03-06: qty 1
  Filled 2021-03-06: qty 2
  Filled 2021-03-06: qty 1
  Filled 2021-03-06: qty 2
  Filled 2021-03-06 (×3): qty 1
  Filled 2021-03-06 (×6): qty 2

## 2021-03-06 MED ORDER — HYDRALAZINE HCL 25 MG PO TABS
25.0000 mg | ORAL_TABLET | Freq: Three times a day (TID) | ORAL | Status: DC
  Administered 2021-03-06 – 2021-03-08 (×8): 25 mg via ORAL
  Filled 2021-03-06 (×9): qty 1

## 2021-03-06 MED ORDER — SODIUM CHLORIDE 0.9 % IV BOLUS
1000.0000 mL | Freq: Once | INTRAVENOUS | Status: AC
  Administered 2021-03-06: 1000 mL via INTRAVENOUS

## 2021-03-06 MED ORDER — BISACODYL 5 MG PO TBEC
5.0000 mg | DELAYED_RELEASE_TABLET | Freq: Every day | ORAL | Status: DC | PRN
  Administered 2021-03-08: 5 mg via ORAL
  Filled 2021-03-06: qty 1

## 2021-03-06 MED ORDER — SODIUM CHLORIDE 0.9 % IV SOLN
2.0000 g | INTRAVENOUS | Status: DC
  Administered 2021-03-06 – 2021-03-10 (×5): 2 g via INTRAVENOUS
  Filled 2021-03-06 (×5): qty 20

## 2021-03-06 MED ORDER — SODIUM CHLORIDE 0.9% FLUSH
3.0000 mL | Freq: Two times a day (BID) | INTRAVENOUS | Status: DC
  Administered 2021-03-06 – 2021-03-12 (×12): 3 mL via INTRAVENOUS

## 2021-03-06 MED ORDER — SODIUM CHLORIDE 0.9 % IV SOLN: INTRAVENOUS | Status: DC

## 2021-03-06 MED ORDER — VALSARTAN-HYDROCHLOROTHIAZIDE 320-25 MG PO TABS: 1.0000 | ORAL_TABLET | Freq: Every day | ORAL | Status: DC

## 2021-03-06 MED ORDER — AMLODIPINE BESYLATE 10 MG PO TABS
10.0000 mg | ORAL_TABLET | Freq: Every day | ORAL | Status: DC
  Administered 2021-03-06 – 2021-03-08 (×3): 10 mg via ORAL
  Filled 2021-03-06: qty 2
  Filled 2021-03-06 (×2): qty 1

## 2021-03-06 MED ORDER — HYDROCHLOROTHIAZIDE 25 MG PO TABS
25.0000 mg | ORAL_TABLET | Freq: Every day | ORAL | Status: DC
  Administered 2021-03-06 – 2021-03-08 (×3): 25 mg via ORAL
  Filled 2021-03-06 (×3): qty 1

## 2021-03-06 MED ORDER — ONDANSETRON HCL 4 MG PO TABS: 4.0000 mg | ORAL_TABLET | Freq: Four times a day (QID) | ORAL | Status: DC | PRN

## 2021-03-06 MED ORDER — IRBESARTAN 300 MG PO TABS
300.0000 mg | ORAL_TABLET | Freq: Every day | ORAL | Status: DC
  Administered 2021-03-07 – 2021-03-08 (×2): 300 mg via ORAL
  Filled 2021-03-06 (×2): qty 1

## 2021-03-06 MED ORDER — VANCOMYCIN HCL IN DEXTROSE 1-5 GM/200ML-% IV SOLN
1000.0000 mg | Freq: Once | INTRAVENOUS | Status: AC
  Administered 2021-03-06: 1000 mg via INTRAVENOUS
  Filled 2021-03-06: qty 200

## 2021-03-06 MED ORDER — PIPERACILLIN-TAZOBACTAM 3.375 G IVPB 30 MIN
3.3750 g | Freq: Once | INTRAVENOUS | Status: AC
  Administered 2021-03-06: 3.375 g via INTRAVENOUS
  Filled 2021-03-06: qty 50

## 2021-03-06 MED ORDER — ALPRAZOLAM 0.25 MG PO TABS
0.2500 mg | ORAL_TABLET | Freq: Two times a day (BID) | ORAL | Status: DC | PRN
  Administered 2021-03-06 – 2021-03-12 (×6): 0.25 mg via ORAL
  Filled 2021-03-06 (×7): qty 1

## 2021-03-06 MED ORDER — HALOPERIDOL LACTATE 5 MG/ML IJ SOLN
2.0000 mg | Freq: Four times a day (QID) | INTRAMUSCULAR | Status: DC | PRN
  Administered 2021-03-06: 2 mg via INTRAVENOUS
  Filled 2021-03-06: qty 1

## 2021-03-06 MED ORDER — ONDANSETRON HCL 4 MG/2ML IJ SOLN: 4.0000 mg | Freq: Four times a day (QID) | INTRAMUSCULAR | Status: DC | PRN

## 2021-03-06 NOTE — ED Notes (Signed)
Pt in MRI.

## 2021-03-06 NOTE — Progress Notes (Signed)
Pharmacy Antibiotic Note  [name] is a 87 y.o. female admitted on 03/05/2021 with cellulitis.  Pharmacy has been consulted for vancomycin dosing.  Plan: Vancomycin 1000 IV q36h (eAUC 483, Cr 0.95)  x 7 days per initial consult  Weight: 58.5 kg (128 lb 15.5 oz)  Temp (24hrs), Avg:97.7 F (36.5 C), Min:97.2 F (36.2 C), Max:98.1 F (36.7 C)  Recent Labs  Lab 03/05/21 1607 03/06/21 0919  WBC 19.8*  --   CREATININE 0.95  --   LATICACIDVEN 3.3* 1.7    Estimated Creatinine Clearance: 34.2 mL/min (by C-G formula based on SCr of 0.95 mg/dL).    No Known Allergies  Antimicrobials this admission: Vanc 7/26 x 7 days >>  CTX 7/26 > Flagyl 7/26 > Zosyn x1 dose  Dose adjustments this admission: N/A  Microbiology results: 7/26 BCx:  7/26 UCx:   Thank you for allowing pharmacy to be a part of this patient's care.  Loleta Dicker, PharmD, [facility] Emergency Medicine Clinical Pharmacist ED RPh Phone: [phone] Main RX: [phone]

## 2021-03-06 NOTE — ED Provider Notes (Signed)
[facility] EMERGENCY DEPARTMENT Provider Note   CSN: [id number] Arrival date & time: 03/05/21  1555     History Chief Complaint  Patient presents with   Wound Check    [name] is a 87 y.o. female.  Patient presents to ER chief complaint of right heel pain.  History obtained from patient and from her daughter.  They state that she lives alone and has mild dementia.  She has noticed some wound developing on the right heel posterior aspect for about 10 days.  The wound recently popped about 2 days ago with brownish discharge and she was brought to the ER.  No reports of any fevers.  No reports of pain from the patient.  No reports of vomiting or diarrhea or chest pain or abdominal pain.      Past Medical History:  Diagnosis Date   Hypertension     Patient Active Problem List   Diagnosis Date Noted   Sepsis due to undetermined organism (HCC) 03/06/2021    History reviewed. No pertinent surgical history.   OB History   No obstetric history on file.     No family history on file.  Social History   Tobacco Use   Smoking status: Never   Smokeless tobacco: Never  Substance Use Topics   Alcohol use: Not Currently   Drug use: Never    Home Medications Prior to Admission medications   Medication Sig Start Date End Date Taking? Authorizing Provider  ALPRAZolam (XANAX) 0.25 MG tablet Take 0.25 mg by mouth 2 (two) times daily as needed. 09/12/17   [provider]  amLODipine (NORVASC) 10 MG tablet Take 10 mg by mouth daily. 09/11/17   [provider]  naproxen (NAPROSYN) 375 MG tablet Take 1 tablet (375 mg total) by mouth 2 (two) times daily. 01/11/21   Wieters, Hallie C, PA-C  valsartan-hydrochlorothiazide (DIOVAN-HCT) 320-25 MG tablet Take 1 tablet by mouth daily. 07/17/17   [provider]    Allergies    Patient has no known allergies.  Review of Systems   Review of Systems  Unable to perform ROS: Dementia   Physical  Exam Updated Vital Signs BP (!) 174/68 (BP Location: Right Arm)   Pulse (!) 103   Temp 98.1 F (36.7 C)   Resp 17   Wt 58.5 kg   SpO2 98%   BMI 18.51 kg/m   Physical Exam Constitutional:      General: She is not in acute distress.    Appearance: Normal appearance.  HENT:     Head: Normocephalic.     Nose: Nose normal.  Eyes:     Extraocular Movements: Extraocular movements intact.  Cardiovascular:     Rate and Rhythm: Normal rate.  Pulmonary:     Effort: Pulmonary effort is normal.  Musculoskeletal:        General: Normal range of motion.     Cervical back: Normal range of motion.  Skin:    Comments: Right heel posterior aspect has regional cellulitis increased warmth and approximately 2 x 2 centimeter wound.  Brownish discharge noted.  Neurological:     General: No focal deficit present.     Mental Status: She is alert. Mental status is at baseline.    ED Results / Procedures / Treatments   Labs (all labs ordered are listed, but only abnormal results are displayed) Labs Reviewed  CBC WITH DIFFERENTIAL/PLATELET - Abnormal; Notable for the following components:      Result Value  WBC 19.8 (*)    RBC 5.49 (*)    HCT 47.8 (*)    Platelets 445 (*)    Neutro Abs 18.2 (*)    Abs Immature Granulocytes 0.14 (*)    All other components within normal limits  COMPREHENSIVE METABOLIC PANEL - Abnormal; Notable for the following components:   Potassium 3.2 (*)    Chloride 96 (*)    Glucose, Bld 183 (*)    Albumin 2.9 (*)    AST 43 (*)    GFR, Estimated 56 (*)    All other components within normal limits  LACTIC ACID, PLASMA - Abnormal; Notable for the following components:   Lactic Acid, Venous 3.3 (*)    All other components within normal limits  CULTURE, BLOOD (ROUTINE X 2)  CULTURE, BLOOD (ROUTINE X 2)  RESP PANEL BY RT-PCR (FLU A&B, COVID) ARPGX2  URINE CULTURE  LACTIC ACID, PLASMA  URINALYSIS, ROUTINE W REFLEX MICROSCOPIC    EKG None  Radiology DG Foot  Complete Right  Result Date: 03/05/2021 CLINICAL DATA:  Foot infection EXAM: RIGHT FOOT COMPLETE - 3+ VIEW COMPARISON:  None. FINDINGS: There is no evidence of acute fracture. Hallux valgus with mild first MTP degenerative change. There is no visible bone destruction/osseous erosion. There is diffuse soft tissue swelling of the ankle and foot. Vascular calcifications. IMPRESSION: Diffuse soft tissue swelling of the ankle and foot. No acute osseous abnormality identified. Electronically Signed   By: Caprice Renshaw   On: 03/05/2021 18:31    Procedures Procedures   Medications Ordered in ED Medications  vancomycin (VANCOCIN) IVPB 1000 mg/200 mL premix (has no administration in time range)  piperacillin-tazobactam (ZOSYN) IVPB 3.375 g (has no administration in time range)  potassium chloride SA (KLOR-CON) CR tablet 40 mEq (has no administration in time range)  sodium chloride 0.9 % bolus 1,000 mL (has no administration in time range)    ED Course  I have reviewed the triage vital signs and the nursing notes.  Pertinent labs & imaging results that were available during my care of the patient were reviewed by me and considered in my medical decision making (see chart for details).    MDM Rules/Calculators/A&P                           Labs show elevated white count 3.3, white count elevated to approximately 20.  Blood pressure remained stable.  Blood cultures are sent, patient ordered a liter bolus of fluids potassium repletion, broad-spectrum antibiotic started.  Hospitalist have been consulted.  I did discuss this case with the patient's daughter.  She notes that the patient has mild dementia and is concerned that she may not be able to make all of her own decisions.    Final Clinical Impression(s) / ED Diagnoses Final diagnoses:  Cellulitis of right lower extremity  Sepsis, due to unspecified organism, unspecified whether acute organ dysfunction present [facility])    Rx / DC Orders ED  Discharge Orders     None        Cheryll Cockayne, MD 03/06/21 [phone]

## 2021-03-06 NOTE — Progress Notes (Signed)
Overnight progress note  Spoke to radiologist Dr. Elvera Maria regarding MRI of right heel.  He reports no obvious signs of osteomyelitis, however, MRI will be reviewed by a musculoskeletal radiologist in the morning and final report will be posted.

## 2021-03-06 NOTE — Consult Note (Signed)
WOC Nurse Consult Note: Reason for Consult:Posterior right heel with ruptured fluid-filled blister Wound type: Full thickness Pressure Injury POA: N/A Measurement: 2cm x 2cm x 0.2cm Wound FGB:MSXJ wound bed, moist Drainage (amount, consistency, odor) When blister ruptured, family reports reddish-brown drainage. Serous at this time Periwound: intact, macerated Dressing procedure/placement/frequency: I will provide guidance for the topical care of this wound with an antimicrobial nonadherent, xeroform. This is to be changed twice daily.  Feet are to be placed into a pressure redistribution heel boot while patient is in bed. A prophylactic silicone foam dressing is to be placed over the sacrum for pressure injury prevention. Turning and repositioning per house protocol.  WOC nursing team will not follow, but will remain available to this patient, the nursing and medical teams.  Please re-consult if needed. Thanks, Ladona Mow, MSN, RN, GNP, [place]  Pager# [phone]

## 2021-03-06 NOTE — ED Notes (Signed)
Pt is very anxious, wanting to leave, making unsafe choices to try and slide down the bed hanging her legs off at an angle to try & get up. Pt's daughter is also very anxious at bedside trying to calm pt down. Pt was given IV & PO anti-anxiolitics.

## 2021-03-06 NOTE — ED Notes (Signed)
Pt sleeping, much more calm & cooperative with plan of care.

## 2021-03-06 NOTE — H&P (Addendum)
History and Physical    [name] CMK:[id number] DOB: [birth year] DOA: 03/05/2021  PCP: Lajean Manes, MD Consultants:  Lorin [facility]cy - orthopedics Patient coming from:  Home - lives alone; NOK: Daughter, Sherri Rad, [phone]  Chief Complaint: Foot wound  HPI: [name] is a 87 y.o. female with medical history significant of HTN, mild dementia presenting with R foot wound.  She was able to walk with a walker until 3 weeks ago.  She had a "friction blister" and they were told to wrap it and not burst it.  They burst the first one she had and it came back "big as an egg" and it burst on its own.  Her daughter noticed it July 15 - her nephew sent her a picture of it.  Her family is concerned about her doing her ADLs - family has been bringing her meals.  She has been unable to walk for the last few weeks.  No fevers.  Her sugar was high when the medics came the other day, about 200 - she does not have known h/o DM.    ED Course: R heel wound x 10 days, blister popped, here with sepsis.  No reported fevers.  WBC 20, lactate 3.3.  Blood cultures pending, broad spectrum antibiotics given.  Urine pending.  EKG pending.  Review of Systems: As per HPI; otherwise review of systems reviewed and negative.   Ambulatory Status:  Ambulated with a walker prior to foot issue  COVID Vaccine Status:   Complete plus booster  Past Medical History:  Diagnosis Date   Dementia ([place])    DNR (do not resuscitate) 03/06/2021   Hypertension    Stage 3a chronic kidney disease ([place]) 03/06/2021    Past Surgical History:  Procedure Laterality Date   ABDOMINAL HYSTERECTOMY     REPLACEMENT TOTAL KNEE BILATERAL      Social History   Socioeconomic History   Marital status: Widowed    Spouse name: Not on file   Number of children: Not on file   Years of education: Not on file   Highest education level: Not on file  Occupational History   Occupation: retired  Tobacco Use   Smoking status: Never    Smokeless tobacco: Former    Quit date: 1970  Substance and Sexual Activity   Alcohol use: Not Currently   Drug use: Never   Sexual activity: Not Currently  Other Topics Concern   Not on file  Social History Narrative   Not on file   Social Determinants of Health   Financial Resource Strain: Not on file  Food Insecurity: Not on file  Transportation Needs: Not on file  Physical Activity: Not on file  Stress: Not on file  Social Connections: Not on file  Intimate Partner Violence: Not on file    No Known Allergies  History reviewed. No pertinent family history.  Prior to Admission medications   Medication Sig Start Date End Date Taking? Authorizing Provider  ALPRAZolam (XANAX) 0.25 MG tablet Take 0.25 mg by mouth 2 (two) times daily as needed. 09/12/17   [provider]  amLODipine (NORVASC) 10 MG tablet Take 10 mg by mouth daily. 09/11/17   [provider]  naproxen (NAPROSYN) 375 MG tablet Take 1 tablet (375 mg total) by mouth 2 (two) times daily. 01/11/21   Wieters, Hallie C, PA-C  valsartan-hydrochlorothiazide (DIOVAN-HCT) 320-25 MG tablet Take 1 tablet by mouth daily. 07/17/17   [provider]    Physical Exam: Vitals:  03/06/21 0930 03/06/21 0945 03/06/21 1000 03/06/21 1015  BP: (!) 180/71 (!) 168/63  (!) 191/66  Pulse: 96 91 94 95  Resp: 17   16  Temp:      TempSrc:      SpO2: 98% 100% 100% 100%  Weight:         General:  Appears calm and comfortable and is in NAD; frail, spunky Eyes:  EOMI, normal lids, iris ENT:  grossly normal hearing, lips & tongue, mmm; edentulous Neck:  no LAD, masses or thyromegaly Cardiovascular:  RRR, no m/r/g. 3+ LE edema.  Respiratory:   CTA bilaterally with no wheezes/rales/rhonchi.  Normal respiratory effort. Abdomen:  soft, NT, ND Skin:  Clean-appearing ulcer on her R heel without significant surrounding erythema or streaking     Musculoskeletal:  grossly normal tone BUE/BLE, good ROM, no bony  abnormality Lower extremity:  3+ symmetric B LE edema.  Limited foot exam with no ulcerations other than as described above.  1+ distal pulses, limited by edema. Psychiatric:  grossly normal mood and affect, speech fluent and appropriate, AOx2 Neurologic:  CN 2-12 grossly intact, moves all extremities in coordinated fashion    Radiological Exams on Admission: Independently reviewed - see discussion in A/P where applicable  DG Foot Complete Right  Result Date: 03/05/2021 CLINICAL DATA:  Foot infection EXAM: RIGHT FOOT COMPLETE - 3+ VIEW COMPARISON:  None. FINDINGS: There is no evidence of acute fracture. Hallux valgus with mild first MTP degenerative change. There is no visible bone destruction/osseous erosion. There is diffuse soft tissue swelling of the ankle and foot. Vascular calcifications. IMPRESSION: Diffuse soft tissue swelling of the ankle and foot. No acute osseous abnormality identified. Electronically Signed   By: Maurine Simmering   On: 03/05/2021 18:31    EKG: Independently reviewed.  NSR with rate 98; nonspecific ST changes with no evidence of acute ischemia   Labs on Admission: I have personally reviewed the available labs and imaging studies at the time of the admission.  Pertinent labs:   K+ 3.2 Glucose 183 Albumin 2.9 Lactate 3.3, 1.7 WBC 19.8 Platelets 445   Assessment/Plan Principal Problem:   Sepsis due to undetermined organism [facility]) Active Problems:   Dementia ([place])   Hypertension   Lower extremity ulceration, right, with fat layer exposed ([place])   Stage 3a chronic kidney disease ([place])   Hyperglycemia   Malnutrition ([place])   DNR (do not resuscitate)    Sepsis -Sepsis indicates life-threatening organ dysfunction with mortality >10%, caused by dysregulation to host response.   -SIRS criteria in this patient includes: Leukocytosis, tachycardia, tachypnea  -Patient has evidence of acute organ failure with elevated lactate >2 that is not easily explained by  another condition. -While awaiting blood cultures, this appears to be a preseptic condition. -Sepsis protocol initiated -Suspected source is unknown - UA is pending but patient denies urinary complaints; no apparent URI symptoms; foot infection is a consideration although the wound appears relatively clear; I do not appreciate evidence of cellulitis at this time -Blood and urine cultures pending -Will admit to telemetry overnight -Treat with IV Rocephin/Flagyl/Vanc for undifferentiated sepsis/lower extremity wound infection -Will order procalcitonin level.  Antibiotics would not be indicated for PCT <0.1 and probably should not be used for < 0.25.  >0.5 indicates infection and >>0.5 indicates more serious disease.  As the procalcitonin level normalizes, it will be reasonable to consider de-escalation of antibiotic coverage.  Foot Wound -Foot ulcer is present and draining but without apparent cellulitis or  obvious deep infection -I have ordered ABIs in case revascularization may be indicated -If UA is not indicative of infection, will plan to order MRI to further evaluate her foot as the source of the infection -LE wound order set utilized including labs (CRP, ESR, A1c, prealbumin, and blood cultures) and consults (peripheral vascular navigator; TOC team; wound care; and nutrition)   HTN -Continue Norvasc, hydralazine, HCTZ, Diovan  Stage 3a CKD -Appears to be at/near baseline at this time -Will recheck BMP in AM  Hyperglycemia -Patient with elevated glucose today and also with EMS previously -While this is unlikely to be a significant long-term factor in her health status, it does play a role with her current wound infection -Will check A1c  -If significantly elevated, consider addition of SSI  Malnutrition -Body mass index is 18.51 kg/m..  -The patient has at least 2 indicators for malnutrition (insufficient energy intake, weight loss, loss of muscle mass, loss of subcutaneous fat,  edema, diminished functional status -This is likely due to chronic disease -Will obtain a nutrition consult for further recommendations.   Dementia/Goals of care -Family reports some difficulty with ADLs at baseline -Has not been able to walk for the last 3 weeks due to leg weakness -Appears likely to benefit from SNF rehab  -[place] discussed and patient prefers aggressive treatment approach  -I have discussed code status with the patient and her daughter and  they are in agreement that the patient would not desire resuscitation and would prefer to die a natural death should that situation arise; she will need a gold out of facility DNR form at the time of discharge     Note: This patient has been tested and is negative for the novel coronavirus COVID-19. She has been fully vaccinated against COVID-19.    DVT prophylaxis:  Lovenox Code Status:  DNR - confirmed with patient/family Family Communication: Daughter present throughout evaluation  Disposition Plan:  The patient is from: home  Anticipated d/c is to: be determined  Anticipated d/c date will depend on clinical response to treatment, likely 2-3 days  Patient is currently: acutely ill Consults called: peripheral vascular navigator; TOC team; wound care; and nutrition; PT/OT Admission status: Admit - It is my clinical opinion that admission to INPATIENT is reasonable and necessary because of the expectation that this patient will require hospital care that crosses at least 2 midnights to treat this condition based on the medical complexity of the problems presented.  Given the aforementioned information, the predictability of an adverse outcome is felt to be significant.      Karmen Bongo MD [facility]   How to contact the [facility] Attending or Consulting provider [place] or covering provider during after hours [place], for this patient?  Check the care team in [facility] and look for a) attending/consulting [facility] provider listed and  b) the [facility] team listed Log into [email or web address] and use [place]'s universal password to access. If you do not have the password, please contact the hospital operator. Locate the [facility] provider you are looking for under [facility] and page to a number that you can be directly reached. If you still have difficulty reaching the provider, please page the [facility] (Director on Call) for the Hospitalists listed on amion for assistance.   03/06/2021, 11:12 AM

## 2021-03-07 ENCOUNTER — Inpatient Hospital Stay (HOSPITAL_COMMUNITY): Payer: Medicare PPO

## 2021-03-07 DIAGNOSIS — A419 Sepsis, unspecified organism: Principal | ICD-10-CM

## 2021-03-07 DIAGNOSIS — L03115 Cellulitis of right lower limb: Secondary | ICD-10-CM

## 2021-03-07 DIAGNOSIS — L0390 Cellulitis, unspecified: Secondary | ICD-10-CM | POA: Diagnosis not present

## 2021-03-07 DIAGNOSIS — R739 Hyperglycemia, unspecified: Secondary | ICD-10-CM | POA: Diagnosis not present

## 2021-03-07 LAB — PROCALCITONIN: Procalcitonin: 0.14 ng/mL

## 2021-03-07 LAB — BASIC METABOLIC PANEL
Anion gap: 7 (ref 5–15)
BUN: 19 mg/dL (ref 8–23)
CO2: 33 mmol/L — ABNORMAL HIGH (ref 22–32)
Calcium: 9.2 mg/dL (ref 8.9–10.3)
Chloride: 101 mmol/L (ref 98–111)
Creatinine, Ser: 0.8 mg/dL (ref 0.44–1.00)
GFR, Estimated: >60 mL/min (ref >60)
Glucose, Bld: 160 mg/dL — ABNORMAL HIGH (ref 70–99)
Potassium: 3.3 mmol/L — ABNORMAL LOW (ref 3.5–5.1)
Sodium: 141 mmol/L (ref 135–145)

## 2021-03-07 LAB — CBC
HCT: 38.2 % (ref 36.0–46.0)
Hemoglobin: 11.7 g/dL — ABNORMAL LOW (ref 12.0–15.0)
MCH: 26.5 pg (ref 26.0–34.0)
MCHC: 30.6 g/dL (ref 30.0–36.0)
MCV: 86.6 fL (ref 80.0–100.0)
Platelets: 484 10*3/uL — ABNORMAL HIGH (ref 150–400)
RBC: 4.41 MIL/uL (ref 3.87–5.11)
RDW: 13.2 % (ref 11.5–15.5)
WBC: 16 10*3/uL — ABNORMAL HIGH (ref 4.0–10.5)
nRBC: 0 % (ref 0.0–0.2)

## 2021-03-07 MED ORDER — SORBITOL 70 % SOLN
400.0000 mL | TOPICAL_OIL | Freq: Once | ORAL | Status: AC
  Administered 2021-03-07: 400 mL via RECTAL
  Filled 2021-03-07: qty 120

## 2021-03-07 MED ORDER — BISACODYL 10 MG RE SUPP
10.0000 mg | Freq: Once | RECTAL | Status: AC
  Administered 2021-03-07: 10 mg via RECTAL
  Filled 2021-03-07: qty 1

## 2021-03-07 MED ORDER — ENSURE ENLIVE PO LIQD: 237.0000 mL | Freq: Two times a day (BID) | ORAL | Status: DC

## 2021-03-07 MED ORDER — ENSURE ENLIVE PO LIQD
237.0000 mL | Freq: Every day | ORAL | Status: DC
  Administered 2021-03-08: 237 mL via ORAL

## 2021-03-07 MED ORDER — JUVEN PO PACK
1.0000 | PACK | Freq: Two times a day (BID) | ORAL | Status: DC
  Administered 2021-03-07 – 2021-03-09 (×4): 1 via ORAL
  Filled 2021-03-07 (×5): qty 1

## 2021-03-07 MED ORDER — ADULT MULTIVITAMIN W/MINERALS CH
1.0000 | ORAL_TABLET | Freq: Every day | ORAL | Status: DC
  Administered 2021-03-07 – 2021-03-12 (×6): 1 via ORAL
  Filled 2021-03-07 (×6): qty 1

## 2021-03-07 NOTE — Progress Notes (Signed)
Pt stating that she need to have a BM frequently during the day & was unable to do anything on the bedpan. Rectal exam done & noted to be Impacted. SMOG enema given with only brown & small amount of blood noted in return this pm. Dulcolax supp given rectally.No results at this time. Dr. Randol Kern was made aware today that Cardiac monitoring order had expired. Dr, E stated that pt no longer needed cardiac monitoring.

## 2021-03-07 NOTE — Progress Notes (Signed)
ABI has been completed.  Results can be found under chart review under CV PROC. 03/07/2021 4:13 PM Ettel Albergo RVT, RDMS

## 2021-03-07 NOTE — Progress Notes (Addendum)
Initial Nutrition Assessment  DOCUMENTATION CODES:   Severe malnutrition in context of chronic illness  INTERVENTION:   -1 packet Juven BID, each packet provides 95 calories, 2.5 grams of protein (collagen), and 9.8 grams of carbohydrate (3 grams sugar); also contains 7 grams of L-arginine and L-glutamine, 300 mg vitamin C, 15 mg vitamin E, 1.2 mcg vitamin B-12, 9.5 mg zinc, 200 mg calcium, and 1.5 g  Calcium Beta-hydroxy-Beta-methylbutyrate to support wound healing  -Ensure Enlive po daily, each supplement provides 350 kcal and 20 grams of protein  -Magic cup BID with meals, each supplement provides 290 kcal and 9 grams of protein  -MVI with minerals daily  NUTRITION DIAGNOSIS:   Severe Malnutrition related to chronic illness (dementia) as evidenced by moderate fat depletion, severe fat depletion, moderate muscle depletion, severe muscle depletion.  GOAL:   Patient will meet greater than or equal to 90% of their needs  MONITOR:   PO intake, Supplement acceptance, Labs, Weight trends, Skin, I & O's  REASON FOR ASSESSMENT:   Consult Assessment of nutrition requirement/status  ASSESSMENT:   [name] is a 87 y.o. female with medical history significant of HTN, mild dementia presenting with R foot wound.  She was able to walk with a walker until 3 weeks ago.  She had a "friction blister" and they were told to wrap it and not burst it.  They burst the first one she had and it came back "big as an egg" and it burst on its own.  Her daughter noticed it July 15 - her nephew sent her a picture of it.  Her family is concerned about her doing her ADLs - family has been bringing her meals.  She has been unable to walk for the last few weeks.  No fevers.  Her sugar was high when the medics came the other day, about 200 - she does not have known h/o DM.  Pt admitted with sepsis and rt foot wound.   Reviewed I/O's: -340 ml x 24 hours  UOP: 340 ml x 24 hours  Observed breakfast tray- pt  consumed about 75% of tray.   Spoke with pt at bedside, who reports great appetite. She shares that she eats very well at home and consumes 3 meals per day. She was unable to provide a diet recall or frequently consumed foods ("I just eat whatever is there").   Pt reports that foot wound has been present for a few weeks and had been managed by her PCP.   Pt denies any weight loss. Noted wt has been stable over the past month, however, pt with distant history of weight loss.   Medications reviewed colace, ativan, and 0.9% sodium chloride infusion @ 50 ml/hr.   Labs reviewed: K: 3.3.   NUTRITION - FOCUSED PHYSICAL EXAM:  Flowsheet Row Most Recent Value  Orbital Region Severe depletion  Upper Arm Region Moderate depletion  Thoracic and Lumbar Region Moderate depletion  Buccal Region Severe depletion  Temple Region Severe depletion  Clavicle Bone Region Moderate depletion  Clavicle and Acromion Bone Region Moderate depletion  Scapular Bone Region Moderate depletion  Dorsal Hand Severe depletion  Patellar Region Moderate depletion  Anterior Thigh Region Moderate depletion  Posterior Calf Region Moderate depletion  Edema (RD Assessment) Mild  Hair Reviewed  Eyes Reviewed  Mouth Reviewed  Skin Reviewed  Nails Reviewed       Diet Order:   Diet Order  Diet regular Room service appropriate? Yes; Fluid consistency: Thin  Diet effective now                   EDUCATION NEEDS:   Education needs have been addressed  Skin:  Skin Assessment: Skin Integrity Issues: Skin Integrity Issues:: Diabetic Ulcer, Other (Comment) Diabetic Ulcer: - Other: full thickness wound to rt heel (fluid filled ruptured blister)  Last BM:  Unknown  Height:   Ht Readings from Last 1 Encounters:  02/14/21 5\' 10"  (1.778 m)    Weight:   Wt Readings from Last 1 Encounters:  03/06/21 58.5 kg    Ideal Body Weight:  75.5 kg  BMI:  Body mass index is 18.51 kg/m.  Estimated  Nutritional Needs:   Kcal:  1750-1950  Protein:  90-105 grams  Fluid:  > 1.7 L    03/08/21, RD, LDN, CDCES Registered Dietitian II Certified Diabetes Care and Education Specialist Please refer to [facility] for RD and/or RD on-call/weekend/after hours pager

## 2021-03-07 NOTE — Plan of Care (Signed)
?  Problem: Clinical Measurements: ?Goal: Ability to avoid or minimize complications of infection will improve ?Outcome: Progressing ?  ?Problem: Skin Integrity: ?Goal: Skin integrity will improve ?Outcome: Progressing ?  ?

## 2021-03-07 NOTE — Evaluation (Signed)
Physical Therapy Evaluation Patient Details Name: [name] MRN: [id number] DOB: August 25, 1925 Today's Date: 03/07/2021   History of Present Illness  87yo female admitted 03/05/21 with R found wound. Found to be septic. PMH dementia, HTN, CKD, B TKR  Clinical Impression   Patient received in bed, pleasantly confused and cooperative but very tangential and easily distracted- needed frequent redirection today. Very immobile and required Max to totalAx2 for all aspects of mobility. RN very helpful with mobility attempts today. Unfortunately we were unable to safely get her to [facility] due to weakness, poor initiation, and heavy posterior lean- certainly recommend [place] for all OOB activities at this time. Left in bed on bedpan with all needs met, bed alarm active. Definitely in need of SNF f/u at DC.     Follow Up Recommendations SNF;Supervision/Assistance - 24 hour    Equipment Recommendations  Wheelchair (measurements PT);Wheelchair cushion (measurements PT);Hospital bed;Other (comment);Rolling walker with 5" wheels;3in1 (PT) (hoyer lift and pads)    Recommendations for Other Services       Precautions / Restrictions Precautions Precautions: Fall;Other (comment) Precaution Comments: limited B knee ROM at baseline Restrictions Weight Bearing Restrictions: No      Mobility  Bed Mobility Overal bed mobility: Needs Assistance Bed Mobility: Supine to Sit;Sit to Supine;Rolling Rolling: Max assist   Supine to sit: Max assist;+2 for physical assistance Sit to supine: Max assist;+2 for physical assistance   General bed mobility comments: heavy posterior lean- RN present and assisted with mobility. Pt with very poor sequencing and safety awareness.    Transfers Overall transfer level: Needs assistance Equipment used: Rolling walker (2 wheeled) Transfers: Sit to/from Stand Sit to Stand: Total assist;+2 physical assistance;From elevated surface         General transfer comment:  heavy totalAx2 to try to boost from elevated bed- still with heavy posterior lean, difficulty boosting up to standing due to limited ROM B knees as well. Very little initation noted. Unsafe to attempt pivot today.  Ambulation/Gait             General Gait Details: unable  Stairs            Wheelchair Mobility    Modified Rankin (Stroke Patients Only)       Balance Overall balance assessment: Needs assistance Sitting-balance support: Bilateral upper extremity supported;Feet supported Sitting balance-Leahy Scale: Poor Sitting balance - Comments: heavy posterior lean Postural control: Posterior lean Standing balance support: Bilateral upper extremity supported;During functional activity Standing balance-Leahy Scale: Zero Standing balance comment: unable to get to standing due to posterior lean, weakness                             Pertinent Vitals/Pain Pain Assessment: Faces Faces Pain Scale: Hurts a little bit Pain Location: generalized discomfort Pain Descriptors / Indicators: Aching;Discomfort Pain Intervention(s): Limited activity within patient's tolerance;Monitored during session    Home Living Family/patient expects to be discharged to:: Private residence Living Arrangements: Other (Comment) (room-mate who is a Quarry manager) Available Help at Discharge: Available PRN/intermittently;Friend(s);Family Type of Home: House Home Access: Stairs to enter   [employer] of Steps: 3 STE with U rail Home Layout: One level Home Equipment: Walker - 2 wheels      Prior Function Level of Independence: Independent with assistive device(s)         Comments: family reports at baseline she was able to get up and about on her own, although slowly. Was reliant on family  support when they came to visit to stand up and move around, however. Unclear how much assist she needed at baseline.     Hand Dominance        Extremity/Trunk Assessment   Upper  Extremity Assessment Upper Extremity Assessment: Generalized weakness    Lower Extremity Assessment Lower Extremity Assessment: Generalized weakness    Cervical / Trunk Assessment Cervical / Trunk Assessment: Kyphotic  Communication      Cognition Arousal/Alertness: Awake/alert Behavior During Therapy: Flat affect Overall Cognitive Status: History of cognitive impairments - at baseline                                 General Comments: hx of dementia at baseline. Tries to cover STM deficits with humor- tells me we are in [place] then after a pause, tells me she was kidding. Told me it is March 2000 and she is here in the hospital bc she fell off of something. Very distractable and needs frequent redirection.      General Comments      Exercises     Assessment/Plan    PT Assessment Patient needs continued PT services  PT Problem List Decreased strength;Decreased cognition;Decreased range of motion;Decreased knowledge of use of DME;Decreased activity tolerance;Decreased safety awareness;Decreased mobility;Decreased coordination;Decreased balance       PT Treatment Interventions DME instruction;Balance training;Gait training;Stair training;Functional mobility training;Patient/family education;Cognitive remediation;Therapeutic activities;Therapeutic exercise;Wheelchair mobility training    PT Goals (Current goals can be found in the Care Plan section)  Acute Rehab PT Goals Patient Stated Goal: regain mobility PT Goal Formulation: With family Time For Goal Achievement: 03/21/21 Potential to Achieve Goals: Fair    Frequency Min 2X/week   Barriers to discharge        Co-evaluation               AM-PAC PT "6 Clicks" Mobility  Outcome Measure Help needed turning from your back to your side while in a flat bed without using bedrails?: A Lot Help needed moving from lying on your back to sitting on the side of a flat bed without using bedrails?:  Total Help needed moving to and from a bed to a chair (including a wheelchair)?: Total Help needed standing up from a chair using your arms (e.g., wheelchair or bedside chair)?: Total Help needed to walk in hospital room?: Total Help needed climbing 3-5 steps with a railing? : Total 6 Click Score: 7    End of Session Equipment Utilized During Treatment: Gait belt Activity Tolerance: Patient tolerated treatment well Patient left: in bed;with call bell/phone within reach;with bed alarm set;Other (comment) (on bed pan) Nurse Communication: Mobility status;Need for lift equipment PT Visit Diagnosis: Muscle weakness (generalized) (M62.81);Unsteadiness on feet (R26.81);Difficulty in walking, not elsewhere classified (R26.2)    Time: 2395-3202 PT Time Calculation (min) (ACUTE ONLY): 38 min   Charges:   PT Evaluation $PT Eval Moderate Complexity: 1 Mod PT Treatments $Therapeutic Activity: 23-37 mins       Windell Norfolk, DPT, PN1   Supplemental Physical Therapist [place]    Pager [phone] Acute Rehab Office [phone]

## 2021-03-07 NOTE — NC FL2 (Signed)
[place] MEDICAID FL2 LEVEL OF CARE SCREENING TOOL     IDENTIFICATION  Patient Name: [name] Birthdate: 1926-04-26 Sex: female Admission Date (Current Location): 03/05/2021  [facility] and [place] Number:  Producer, television/film/video and Address:  [facility] [place]. [facility], [address], [place], [place] [address]      Provider Number: [id number]  Attending Physician Name and Address:  Starleen Arms, MD  Relative Name and Phone Number:  Talbert Forest, daughter, [phone]    Current Level of Care: Hospital Recommended Level of Care: Skilled Nursing Facility Prior Approval Number:    Date Approved/Denied:   PASRR Number: [id number]  Discharge Plan: SNF    Current Diagnoses: Patient Active Problem List   Diagnosis Date Noted   Sepsis due to undetermined organism (HCC) 03/06/2021   Dementia (HCC) 03/06/2021   Hypertension 03/06/2021   Lower extremity ulceration, right, with fat layer exposed (HCC) 03/06/2021   Stage 3a chronic kidney disease (HCC) 03/06/2021   Hyperglycemia 03/06/2021   Malnutrition (HCC) 03/06/2021   DNR (do not resuscitate) 03/06/2021    Orientation RESPIRATION BLADDER Height & Weight     Self  Normal Incontinent, External catheter Weight: 128 lb 15.5 oz (58.5 kg) Height:     BEHAVIORAL SYMPTOMS/MOOD NEUROLOGICAL BOWEL NUTRITION STATUS      Continent Diet (See dc summary)  AMBULATORY STATUS COMMUNICATION OF NEEDS Skin   Extensive Assist Verbally Other (Comment) (wound on heel with twice daily guaze dressing changes)                       Personal Care Assistance Level of Assistance  Bathing, Feeding, Dressing Bathing Assistance: Maximum assistance Feeding assistance: Maximum assistance Dressing Assistance: Maximum assistance     Functional Limitations Info             SPECIAL CARE FACTORS FREQUENCY  PT (By licensed PT), OT (By licensed OT)     PT Frequency: 5x/week OT Frequency: 5x/week             Contractures Contractures Info: Not present    Additional Factors Info  Code Status, Allergies Code Status Info: DNR Allergies Info: NKA           Current Medications (03/07/2021):  This is the current hospital active medication list Current Facility-Administered Medications  Medication Dose Route Frequency Provider Last Rate Last Admin   acetaminophen (TYLENOL) tablet 650 mg  650 mg Oral Q6H PRN Jonah Blue, MD       Or   acetaminophen (TYLENOL) suppository 650 mg  650 mg Rectal Q6H PRN Jonah Blue, MD       ALPRAZolam Prudy Feeler) tablet 0.25 mg  0.25 mg Oral BID PRN Jonah Blue, MD   0.25 mg at 03/07/21 0939   amLODipine (NORVASC) tablet 10 mg  10 mg Oral Daily Jonah Blue, MD   10 mg at 03/07/21 0931   bisacodyl (DULCOLAX) EC tablet 5 mg  5 mg Oral Daily PRN Jonah Blue, MD       bisacodyl (DULCOLAX) suppository 10 mg  10 mg Rectal Once Elgergawy, Leana Roe, MD       cefTRIAXone (ROCEPHIN) 2 g in sodium chloride 0.9 % 100 mL IVPB  2 g Intravenous Q24H Jonah Blue, MD   Stopped at 03/06/21 1800   docusate sodium (COLACE) capsule 100 mg  100 mg Oral BID Jonah Blue, MD   100 mg at 03/07/21 0930   enoxaparin (LOVENOX) injection 40 mg  40 mg Subcutaneous  Q24H Jonah Blue, MD   40 mg at 03/07/21 1236   [START ON 03/08/2021] feeding supplement (ENSURE ENLIVE / ENSURE PLUS) liquid 237 mL  237 mL Oral QHS Elgergawy, Leana Roe, MD       haloperidol lactate (HALDOL) injection 2 mg  2 mg Intravenous Q6H PRN Jonah Blue, MD   2 mg at 03/06/21 1222   hydrALAZINE (APRESOLINE) injection 5 mg  5 mg Intravenous Q4H PRN Jonah Blue, MD   5 mg at 03/07/21 0034   hydrALAZINE (APRESOLINE) tablet 25 mg  25 mg Oral TID Jonah Blue, MD   25 mg at 03/07/21 0931   hydrochlorothiazide (HYDRODIURIL) tablet 25 mg  25 mg Oral Daily Jonah Blue, MD   25 mg at 03/07/21 0930   HYDROcodone-acetaminophen (NORCO/VICODIN) 5-325 MG per tablet 1-2 tablet  1-2 tablet Oral Q4H  PRN Jonah Blue, MD   1 tablet at 03/07/21 1427   irbesartan (AVAPRO) tablet 300 mg  300 mg Oral Daily Jonah Blue, MD   300 mg at 03/07/21 0930   LORazepam (ATIVAN) injection 1 mg  1 mg Intravenous Once Jonah Blue, MD       metroNIDAZOLE (FLAGYL) IVPB 500 mg  500 mg Intravenous Bobette Mo, MD 100 mL/hr at 03/07/21 0932 500 mg at 03/07/21 0932   morphine 2 MG/ML injection 2 mg  2 mg Intravenous Q2H PRN Jonah Blue, MD       multivitamin with minerals tablet 1 tablet  1 tablet Oral Daily Elgergawy, Leana Roe, MD       nutrition supplement (JUVEN) (JUVEN) powder packet 1 packet  1 packet Oral BID BM Elgergawy, Leana Roe, MD       ondansetron (ZOFRAN) tablet 4 mg  4 mg Oral Q6H PRN Jonah Blue, MD       Or   ondansetron [facility]) injection 4 mg  4 mg Intravenous Q6H PRN Jonah Blue, MD       polyethylene glycol (MIRALAX / GLYCOLAX) packet 17 g  17 g Oral Daily PRN Jonah Blue, MD   17 g at 03/06/21 1734   sodium chloride flush (NS) 0.9 % injection 3 mL  3 mL Intravenous Q12H Jonah Blue, MD   3 mL at 03/07/21 0937   sorbitol, milk of mag, mineral oil, glycerin (SMOG) enema  400 mL Rectal Once Elgergawy, Leana Roe, MD       vancomycin (VANCOCIN) IVPB 1000 mg/200 mL premix  1,000 mg Intravenous Q36H Jonah Blue, MD         Discharge Medications: Please see discharge summary for a list of discharge medications.  Relevant Imaging Results:  Relevant Lab Results:   Additional Information SSN: [id number]. Moderna vaccines on 10/11/19, 11/08/19, 07/21/20  Mearl Latin, LCSW

## 2021-03-07 NOTE — TOC Initial Note (Addendum)
Transition of Care [facility]) - Initial/Assessment Note    Patient Details  Name: [name] MRN: [id number] Date of Birth: [birth year]  [facility] of [facility]) CM/SW Contact:    Mearl Latin, LCSW Phone Number: 03/07/2021, 3:16 PM  Clinical Narrative:                 CSW received consult for possible SNF placement at time of discharge. CSW spoke with patient's daughter Eather Colas on [employer] phone. She reported that patient lives with a roommate but she is never there and family is currently unable to care for patient at their home given patient's current physical needs and fall risk. She expressed understanding of PT recommendation and is agreeable to SNF placement at time of discharge. Patient reports preference for a facility in [place]. CSW discussed insurance authorization process and provided Medicare SNF ratings list. Patient has received three COVID vaccines. She requested CSW contact [facility] with bed offers. No further questions reported at this time.   Skilled Nursing Rehab Facilities-   [email or web address] *Ratings updated quarterly   Ratings out of 5 possible   Name Address  Phone # Quality Care Staffing Health Inspection Overall  [facility] [address] [place] [phone] 5  4 4   [facility]  [address], [place] [phone] 4 2 4 4   [facility] [address] [place], [address] [place] 2 3 1 1   [facility] [address] 3 3 4 4   [facility] [address], [phone] 3  2 2   [facility] [address], [place] [phone] 2 2 4 4   [facility] [address], [address] [place] 5 2 2 3   [facility] [address], [facility] [place] 4 2 2 2   [facility] [address], [facility] 5 2 2 3   [facility] [phone], [phone] 5 1 2 2   [facility] [address], [facility] [phone] 5 2 2 3   [facility]. [address] 3 1 1 1      Expected Discharge Plan: Skilled Nursing Facility Barriers to Discharge: Continued Medical Work up, [phone], SNF Pending bed offer   Patient Goals and CMS Choice Patient states their goals for this hospitalization and ongoing recovery are:: Rehab CMS Medicare.gov Compare Post Acute Care list provided to:: Patient Represenative (must comment) Choice offered to / list presented to : Adult Children  Expected Discharge Plan and Services Expected Discharge Plan: Skilled Nursing Facility In-house Referral: Clinical Social Work   Post Acute Care Choice: Skilled Nursing Facility Living arrangements for the past 2 months: Single Family Home                                      Prior Living Arrangements/Services Living arrangements for the past 2 months: Single Family Home Lives with:: Roommate Patient language and need for interpreter reviewed:: Yes Do you feel safe going back to the place where you live?: Yes      Need for Family Participation in Patient Care: Yes (Comment) Care giver support system in place?: Yes (comment)   Criminal Activity/Legal Involvement Pertinent to Current Situation/Hospitalization: No - Comment as needed  Activities of Daily Living Home Assistive Devices/Equipment: [phone] (specify type) ADL Screening (condition at time of admission) Patient's cognitive ability adequate to safely complete daily activities?:  No Is the patient deaf or have difficulty hearing?: Yes Does the patient have difficulty seeing, even when wearing glasses/contacts?: No Does the patient have difficulty concentrating, remembering, or making decisions?: Yes Patient able to express need for assistance with ADLs?: No Does the patient have difficulty dressing or bathing?: Yes Independently performs ADLs?: No Communication:  Dependent Dressing (OT): Dependent Grooming: Dependent Feeding: Dependent Bathing: Dependent Toileting: Dependent In/Out Bed: Dependent Walks in Home: Dependent Does the patient have difficulty walking or climbing stairs?: Yes Weakness of Legs: Both Weakness of Arms/Hands: Both  Permission Sought/Granted Permission sought to share information with : Facility Medical sales representative, Family Supports Permission granted to share information with : No  Share Information with NAME: Shirley/Delores  Permission granted to share info w AGENCY: SNFs  Permission granted to share info w Relationship: Daughters  Permission granted to share info w Contact Information: [phone]  Emotional Assessment Appearance:: Appears stated age Attitude/Demeanor/Rapport: Unable to Assess Affect (typically observed): Unable to Assess Orientation: : Oriented to Self Alcohol / Substance Use: Not Applicable Psych Involvement: No (comment)  Admission diagnosis:  Cellulitis of right lower extremity [L03.115] Sepsis due to undetermined organism (HCC) [A41.9] Sepsis, due to unspecified organism, unspecified whether acute organ dysfunction present [facility]) [A41.9] Patient Active Problem List   Diagnosis Date Noted   Sepsis due to undetermined organism (HCC) 03/06/2021   Dementia (HCC) 03/06/2021   Hypertension 03/06/2021   Lower extremity ulceration, right, with fat layer exposed (HCC) 03/06/2021   Stage 3a chronic kidney disease (HCC) 03/06/2021   Hyperglycemia 03/06/2021   Malnutrition (HCC) 03/06/2021   DNR (do not resuscitate) 03/06/2021   PCP:  Merlene Laughter, MD Pharmacy:   [facility] [phone] [place], [place] - [address] [place] [address] Phone: [phone] Fax: [phone]     Social Determinants of Health (SDOH) Interventions    Readmission Risk Interventions No flowsheet data found.

## 2021-03-07 NOTE — Progress Notes (Signed)
PROGRESS NOTE    [name]  IDP:[id number] DOB: [birth year] DOA: 03/05/2021 PCP: Lajean Manes, MD    Chief Complaint  Patient presents with   Wound Check    Brief Narrative:   [name] is a 87 y.o. female with medical history significant of HTN, mild dementia presenting with R foot wound.  She was able to walk with a walker until 3 weeks ago.  She had a "friction blister" and they were told to wrap it and not burst it.  They burst the first one she had and it came back "big as an egg" and it burst on its own.  Her daughter noticed it July 15 - her nephew sent her a picture of it.  Her family is concerned about her doing her ADLs - family has been bringing her meals.  She has been unable to walk for the last few weeks.  No fevers.  Her sugar was high when the medics came the other day, about 200 - she does not have known h/o DM.  Assessment & Plan:   Principal Problem:   Sepsis due to undetermined organism [facility]) Active Problems:   Dementia ([place])   Hypertension   Lower extremity ulceration, right, with fat layer exposed ([place])   Stage 3a chronic kidney disease ([place])   Hyperglycemia   Malnutrition ([place])   DNR (do not resuscitate)    Sepsis due to infected right heel ulcer, with surrounding cellulitis with myofasciitis -Sepsis criteria present on admission, leukocytosis, tachycardia, tachypnea with elevated lactic acid. -Continue with IV Rocephin and vancomycin -follow-up on blood cultures -MRI significant for cellulitis with myofasciitis  Foot Wound -Patient was bilateral ear pressure ulcer, the right got infected. -We will consult wound care -LE wound order set utilized including labs (CRP, ESR, A1c, prealbumin, and blood cultures) and consults (peripheral vascular navigator; TOC team; wound care; and nutrition)    HTN -Continue Norvasc, hydralazine, HCTZ, Diovan   Stage 3a CKD -Appears to be at/near baseline at this time   Hyperglycemia -Will check A1c -If  significantly elevated, consider addition of SSI   Malnutrition -Body mass index is 18.51 kg/m.. -The patient has at least 2 indicators for malnutrition (insufficient energy intake, weight loss, loss of muscle mass, loss of subcutaneous fat, edema, diminished functional status -This is likely due to chronic disease -Will obtain a nutrition consult for further recommendations.    Dementia/Goals of care -Family reports some difficulty with ADLs at baseline -Has not been able to walk for the last 3 weeks due to leg weakness -Appears likely to benefit from SNF rehab -[place] discussed and patient prefers aggressive treatment approach -I have discussed code status with the patient and her daughter and  they are in agreement that the patient would not desire resuscitation and would prefer to die a natural death should that situation arise; she will need a gold out of facility DNR form at the time of discharge        Note: This patient has been tested and is negative for the novel coronavirus COVID-19. She has been fully vaccinated against COVID-19.    DVT prophylaxis: Lovenox Code Status: Full Family Communication: daughter at bedside Disposition:   Status is: Inpatient  Remains inpatient appropriate because:IV treatments appropriate due to intensity of illness or inability to take PO  Dispo: The patient is from: Home              Anticipated d/c is to: SNF  Patient currently is not medically stable to d/c.   Difficult to place patient Yes       Subjective:  Patient denies any fever, chills, no nausea or vomiting.  Objective: Vitals:   03/07/21 0034 03/07/21 0400 03/07/21 0748 03/07/21 1129  BP: (!) 165/68 (!) 137/53 (!) 124/56 (!) 150/57  Pulse: 100 86 75 96  Resp: _0 Temp: (!) 97.5 F (36.4 C) 97.6 F (36.4 C) 97.6 F (36.4 C) 97.8 F (36.6 C)  TempSrc: Axillary Axillary Axillary Oral  SpO2: 100% 100% 100% 100%  Weight:        Intake/Output  Summary (Last 24 hours) at 03/07/2021 1505 Last data filed at 03/07/2021 1339 Gross per 24 hour  Intake 240 ml  Output 340 ml  Net -100 ml   Filed Weights   03/06/21 0852  Weight: 58.5 kg    Examination:  Awake Alert, frail, deconditioned and pleasant  symmetrical Chest wall movement, Good air movement bilaterally, CTAB RRR,No Gallops,Rubs or new Murmurs, No Parasternal Heave +ve B.Sounds, Abd Soft, No tenderness, No rebound - guarding or rigidity. No Cyanosis, Clubbing or edema, B/L heel wound.          Data Reviewed: I have personally reviewed following labs and imaging studies  CBC: Recent Labs  Lab 03/05/21 1607 03/07/21 0059  WBC 19.8* 16.0*  NEUTROABS 18.2*  --   HGB 14.8 11.7*  HCT 47.8* 38.2  MCV 87.1 86.6  PLT 445* 484*    Basic Metabolic Panel: Recent Labs  Lab 03/05/21 1607 03/07/21 0059  NA 139 141  K 3.2* 3.3*  CL 96* 101  CO2 30 33*  GLUCOSE 183* 160*  BUN 23 19  CREATININE 0.95 0.80  CALCIUM 9.7 9.2    GFR: Estimated Creatinine Clearance: 40.6 mL/min (by C-G formula based on SCr of 0.8 mg/dL).  Liver Function Tests: Recent Labs  Lab 03/05/21 1607  AST 43*  ALT 37  ALKPHOS 81  BILITOT 0.6  PROT 6.9  ALBUMIN 2.9*    CBG: No results for input(s): GLUCAP in the last 168 hours.   Recent Results (from the past 240 hour(s))  Resp Panel by RT-PCR (Flu A&B, Covid) Nasopharyngeal Swab     Status: None   Collection Time: 03/06/21  9:19 AM   Specimen: Nasopharyngeal Swab; Nasopharyngeal(NP) swabs in vial transport medium  Result Value Ref Range Status   SARS Coronavirus 2 by RT PCR NEGATIVE NEGATIVE Final    Comment: (NOTE) SARS-CoV-2 target nucleic acids are NOT DETECTED.  The SARS-CoV-2 RNA is generally detectable in upper respiratory specimens during the acute phase of infection. The lowest concentration of SARS-CoV-2 viral copies this assay can detect is 138 copies/mL. A negative result does not preclude  SARS-Cov-2 infection and should not be used as the sole basis for treatment or other patient management decisions. A negative result may occur with  improper specimen collection/handling, submission of specimen other than nasopharyngeal swab, presence of viral mutation(s) within the areas targeted by this assay, and inadequate number of viral copies(<138 copies/mL). A negative result must be combined with clinical observations, patient history, and epidemiological information. The expected result is Negative.  Fact Sheet for Patients:  EntrepreneurPulse.com.au  Fact Sheet for Healthcare Providers:  IncredibleEmployment.be  This test is no t yet approved or cleared by the [place] FDA and  has been authorized for detection and/or diagnosis of SARS-CoV-2 by FDA under an Emergency Use Authorization (EUA). This EUA will remain  in effect (  meaning this test can be used) for the duration of the COVID-19 declaration under Section 564(b)(1) of the Act, 21 U.S.C.section 360bbb-3(b)(1), unless the authorization is terminated  or revoked sooner.       Influenza A by PCR NEGATIVE NEGATIVE Final   Influenza B by PCR NEGATIVE NEGATIVE Final    Comment: (NOTE) The Xpert Xpress SARS-CoV-2/FLU/RSV plus assay is intended as an aid in the diagnosis of influenza from Nasopharyngeal swab specimens and should not be used as a sole basis for treatment. Nasal washings and aspirates are unacceptable for Xpert Xpress SARS-CoV-2/FLU/RSV testing.  Fact Sheet for Patients: EntrepreneurPulse.com.au  Fact Sheet for Healthcare Providers: IncredibleEmployment.be  This test is not yet approved or cleared by the [place] FDA and has been authorized for detection and/or diagnosis of SARS-CoV-2 by FDA under an Emergency Use Authorization (EUA). This EUA will remain in effect (meaning this test can be used) for the duration of  the COVID-19 declaration under Section 564(b)(1) of the Act, 21 U.S.C. section 360bbb-3(b)(1), unless the authorization is terminated or revoked.  Performed at [facility], [place] [address] [place], [place] [address]   Blood culture (routine x 2)     Status: None (Preliminary result)   Collection Time: 03/06/21  9:25 AM   Specimen: BLOOD  Result Value Ref Range Status   Specimen Description BLOOD SITE NOT SPECIFIED  Final   Special Requests   Final    BOTTLES DRAWN AEROBIC AND ANAEROBIC Blood Culture adequate volume   Culture   Final    NO GROWTH 1 DAY Performed at [facility], [place] [address] [place], [place] [address]    Report Status PENDING  Incomplete  Blood culture (routine x 2)     Status: None (Preliminary result)   Collection Time: 03/06/21  9:27 AM   Specimen: BLOOD  Result Value Ref Range Status   Specimen Description BLOOD SITE NOT SPECIFIED  Final   Special Requests   Final    BOTTLES DRAWN AEROBIC AND ANAEROBIC Blood Culture results may not be optimal due to an inadequate volume of blood received in culture bottles   Culture   Final    NO GROWTH 1 DAY Performed at [facility], [place] [address], [place], [place] [address]    Report Status PENDING  Incomplete         Radiology Studies: MR HEEL RIGHT W WO CONTRAST  Result Date: 03/07/2021 CLINICAL DATA:  Foot pain and swelling. EXAM: MR OF THE RIGHT HEEL WITHOUT AND WITH CONTRAST TECHNIQUE: Multiplanar, multisequence MR imaging of the right foot was performed both before and after administration of intravenous contrast. CONTRAST:  51m GADAVIST GADOBUTROL 1 MMOL/ML IV SOLN COMPARISON:  Radiographs, same date. FINDINGS: Diffuse subcutaneous soft tissue swelling/edema consistent with cellulitis. There is also diffuse myofasciitis. No discrete rim enhancing soft tissue abscess is identified. No findings to suggest pyomyositis. No MR findings suspicious for septic arthritis or osteomyelitis. The major  tendons and ligaments are intact. IMPRESSION: Diffuse cellulitis and myofasciitis but no discrete soft tissue abscess, pyomyositis, septic arthritis or osteomyelitis. Electronically Signed   By: PMarijo SanesM.D.   On: 03/07/2021 05:05   DG Foot Complete Right  Result Date: 03/05/2021 CLINICAL DATA:  Foot infection EXAM: RIGHT FOOT COMPLETE - 3+ VIEW COMPARISON:  None. FINDINGS: There is no evidence of acute fracture. Hallux valgus with mild first MTP degenerative change. There is no visible bone destruction/osseous erosion. There is diffuse soft tissue swelling  of the ankle and foot. Vascular calcifications. IMPRESSION: Diffuse soft tissue swelling of the ankle and foot. No acute osseous abnormality identified. Electronically Signed   By: Maurine Simmering   On: 03/05/2021 18:31        Scheduled Meds:  amLODipine  10 mg Oral Daily   bisacodyl  10 mg Rectal Once   docusate sodium  100 mg Oral BID   enoxaparin (LOVENOX) injection  40 mg Subcutaneous Q24H   [START ON 03/08/2021] feeding supplement  237 mL Oral QHS   hydrALAZINE  25 mg Oral TID   hydrochlorothiazide  25 mg Oral Daily   irbesartan  300 mg Oral Daily   LORazepam  1 mg Intravenous Once   multivitamin with minerals  1 tablet Oral Daily   nutrition supplement (JUVEN)  1 packet Oral BID BM   sodium chloride flush  3 mL Intravenous Q12H   sorbitol, milk of mag, mineral oil, glycerin (SMOG) enema  400 mL Rectal Once   Continuous Infusions:  sodium chloride 50 mL/hr at 03/07/21 1238   cefTRIAXone (ROCEPHIN)  IV Stopped (03/06/21 1800)   metronidazole 500 mg (03/07/21 0932)   vancomycin       LOS: 1 day       Phillips Climes, MD [facility]   To contact the attending provider between 7A-7P or the covering provider during after hours 7P-7A, please log into the web site [email or web address] and access using universal [place] password for that web site. If you do not have the password, please call the hospital  operator.  03/07/2021, 3:05 PM

## 2021-03-07 NOTE — Progress Notes (Addendum)
Occupational Therapy Evaluation Patient Details Name: [name] MRN: [id number] DOB: [birth year] Today's Date: 03/07/2021    History of Present Illness 87yo female admitted 03/05/21 with R found wound. Found to be septic. PMH dementia, HTN, CKD, B TKR   Clinical Impression   Pt presents with above diagnosis. PTA pt PLOF per chart, lived at home with roommate in 1 level house and assistance from family as needed. Unclear of full baseline due to pt presenting as a poor historian with hx of cognitive deficits. Pt is currently limited with safe ADL engagement due to pain, weakness, limited ROM, and decreased safety awareness requiring max A to total A with ADL and functional mobility. Pt will benefit from continued skilled level OT and acute to maximize independence with ADLs with education for caregiver support, AE, HEP, functional mobility and safety awareness.     Follow Up Recommendations  SNF;Supervision/Assistance - 24 hour    Equipment Recommendations   (defer to post acute rehab setting.)    Recommendations for Other Services       Precautions / Restrictions Precautions Precautions: Fall;Other (comment) Precaution Comments: limited B knee ROM at baseline Restrictions Weight Bearing Restrictions: No      Mobility Bed Mobility Overal bed mobility: Needs Assistance Bed Mobility: Supine to Sit;Sit to Supine;Rolling Rolling: Max assist   Supine to sit: Max assist;+2 for physical assistance Sit to supine: Max assist;+2 for physical assistance   General bed mobility comments: Attempted rolling in bed with cues to reach for grabs toward L side. Pt able to reach and grasp but requires Max A with use of scoot pad to roll hips and torso.    Transfers Overall transfer level: Needs assistance Equipment used: Rolling walker (2 wheeled) Transfers: Sit to/from Stand Sit to Stand: Total assist;+2 physical assistance;From elevated surface         General transfer comment:  deferred for safety, PT note repots heavy total A+2 sit to stand transition.    Balance Overall balance assessment: Needs assistance Sitting-balance support: Bilateral upper extremity supported;Feet supported Sitting balance-Leahy Scale: Poor Sitting balance - Comments: heavy posterior lean Postural control: Posterior lean Standing balance support: Bilateral upper extremity supported;During functional activity Standing balance-Leahy Scale: Zero Standing balance comment: unable to get to standing due to posterior lean, weakness                           ADL either performed or assessed with clinical judgement   ADL Overall ADL's : Needs assistance/impaired Eating/Feeding: Set up;Bed level Eating/Feeding Details (indicate cue type and reason): pt received reclined in bed with self feeding, requires assistance with cutting food to safely eat due to limited hand grip strength. Grooming: Oral care;Set up;Bed level Grooming Details (indicate cue type and reason): Able to assess pt managing toothpaste for oral care         Upper Body Dressing : Minimal assistance;Bed level Upper Body Dressing Details (indicate cue type and reason): Pt demonstrate donning of gown at bed level, OT gave verbal and visual demonstration. Pt able to don R arm independently however, required assist with orientation of L sleeve.   Lower Body Dressing Details (indicate cue type and reason): Not assessed, however, will infer pt will Max A to total A assistance due to limitations with functional mobility and pain.   Toilet Transfer Details (indicate cue type and reason): deferred due to safety.           General ADL Comments:  Session limited due to bed level evaluatuon decreased strength and functional transfers/mobility.     Vision         Perception     Praxis      Pertinent Vitals/Pain Pain Assessment: Faces Faces Pain Scale: Hurts little more Pain Location: generalized discomfort Pain  Descriptors / Indicators: Aching;Discomfort Pain Intervention(s): Limited activity within patient's tolerance;Monitored during session;Repositioned     Hand Dominance Right   Extremity/Trunk Assessment Upper Extremity Assessment Upper Extremity Assessment: Generalized weakness   Lower Extremity Assessment Lower Extremity Assessment: Defer to PT evaluation   Cervical / Trunk Assessment Cervical / Trunk Assessment: Kyphotic   Communication Communication Communication: No difficulties   Cognition Arousal/Alertness: Awake/alert Behavior During Therapy: Flat affect Overall Cognitive Status: History of cognitive impairments - at baseline                                 General Comments: hx of dementia at baseline. Tries to cover STM deficits with humor- tells me we are in [place] then after a pause, tells me she was kidding. Told me it is March 2000 and she is here in the hospital bc she fell off of something. Very distractable and needs frequent redirection.   General Comments       Exercises     Shoulder Instructions      Home Living Family/patient expects to be discharged to:: Private residence Living Arrangements: Other (Comment) (room-mate who is a CNA) Available Help at Discharge: Available PRN/intermittently;Friend(s);Family Type of Home: House Home Access: Stairs to enter [employer] of Steps: 3 STE with U rail   Home Layout: One level     Bathroom Shower/Tub: Chief Strategy Officer: Standard     Home Equipment: Environmental consultant - 2 wheels          Prior Functioning/Environment Level of Independence: Independent with assistive device(s)        Comments: family reports at baseline she was able to get up and about on her own, although slowly. Was reliant on family support when they came to visit to stand up and move around, however. Unclear how much assist she needed at baseline. Hx received from PT note. Pt questionable historian  due to cognitive deficits.        OT Problem List: Decreased strength;Decreased range of motion;Decreased activity tolerance;Impaired balance (sitting and/or standing);Decreased cognition;Decreased safety awareness;Decreased knowledge of use of DME or AE;Pain      OT Treatment/Interventions: Self-care/ADL training;Therapeutic exercise;DME and/or AE instruction;Therapeutic activities;Cognitive remediation/compensation;Patient/family education;Balance training    OT Goals(Current goals can be found in the care plan section) Acute Rehab OT Goals Patient Stated Goal: regain mobility OT Goal Formulation: Patient unable to participate in goal setting Time For Goal Achievement: 03/21/21 Potential to Achieve Goals: Fair  OT Frequency: Min 2X/week   Barriers to D/C: Decreased caregiver support          Co-evaluation              AM-PAC OT "6 Clicks" Daily Activity     Outcome Measure Help from another person eating meals?: A Little Help from another person taking care of personal grooming?: A Little Help from another person toileting, which includes using toliet, bedpan, or urinal?: Total Help from another person bathing (including washing, rinsing, drying)?: Total Help from another person to put on and taking off regular upper body clothing?: A Lot Help from another person to put  on and taking off regular lower body clothing?: Total 6 Click Score: 11   End of Session Equipment Utilized During Treatment: Other (comment) (Prevalon heel boot) Nurse Communication: Mobility status  Activity Tolerance: Patient limited by pain Patient left: in bed;with call bell/phone within reach;with bed alarm set  OT Visit Diagnosis: Unsteadiness on feet (R26.81);Muscle weakness (generalized) (M62.81);Pain;Cognitive communication deficit (Z66.294)                Time: 7654-6503 OT Time Calculation (min): 28 min Charges:  OT General Charges $OT Visit: 1 Visit OT Evaluation $OT Eval Moderate  Complexity: 1 Mod OT Treatments $Self Care/Home Management : 8-22 mins  Marquette Old, MSOT, OTR/L  Supplemental Rehabilitation Services  [phone]   Zigmund Daniel 03/07/2021, 2:11 PM

## 2021-03-08 DIAGNOSIS — L97912 Non-pressure chronic ulcer of unspecified part of right lower leg with fat layer exposed: Secondary | ICD-10-CM | POA: Diagnosis not present

## 2021-03-08 DIAGNOSIS — E43 Unspecified severe protein-calorie malnutrition: Secondary | ICD-10-CM | POA: Insufficient documentation

## 2021-03-08 DIAGNOSIS — A419 Sepsis, unspecified organism: Secondary | ICD-10-CM | POA: Diagnosis not present

## 2021-03-08 DIAGNOSIS — L03115 Cellulitis of right lower limb: Secondary | ICD-10-CM | POA: Diagnosis not present

## 2021-03-08 LAB — CBC
HCT: 37.8 % (ref 36.0–46.0)
Hemoglobin: 12 g/dL (ref 12.0–15.0)
MCH: 26.9 pg (ref 26.0–34.0)
MCHC: 31.7 g/dL (ref 30.0–36.0)
MCV: 84.8 fL (ref 80.0–100.0)
Platelets: 336 10*3/uL (ref 150–400)
RBC: 4.46 MIL/uL (ref 3.87–5.11)
RDW: 13.2 % (ref 11.5–15.5)
WBC: 14.2 10*3/uL — ABNORMAL HIGH (ref 4.0–10.5)
nRBC: 0 % (ref 0.0–0.2)

## 2021-03-08 LAB — BASIC METABOLIC PANEL
Anion gap: 8 (ref 5–15)
BUN: 21 mg/dL (ref 8–23)
CO2: 28 mmol/L (ref 22–32)
Calcium: 8.8 mg/dL — ABNORMAL LOW (ref 8.9–10.3)
Chloride: 97 mmol/L — ABNORMAL LOW (ref 98–111)
Creatinine, Ser: 0.75 mg/dL (ref 0.44–1.00)
GFR, Estimated: >60 mL/min (ref >60)
Glucose, Bld: 144 mg/dL — ABNORMAL HIGH (ref 70–99)
Potassium: 3.3 mmol/L — ABNORMAL LOW (ref 3.5–5.1)
Sodium: 133 mmol/L — ABNORMAL LOW (ref 135–145)

## 2021-03-08 LAB — OSMOLALITY, URINE: Osmolality, Ur: 595 mOsm/kg (ref 300–900)

## 2021-03-08 LAB — URINE CULTURE

## 2021-03-08 LAB — SODIUM, URINE, RANDOM: Sodium, Ur: 18 mmol/L

## 2021-03-08 LAB — PROCALCITONIN: Procalcitonin: 0.12 ng/mL

## 2021-03-08 LAB — MRSA NEXT GEN BY PCR, NASAL: MRSA by PCR Next Gen: NOT DETECTED

## 2021-03-08 LAB — HEMOGLOBIN A1C
Hgb A1c MFr Bld: 5.5 % (ref 4.8–5.6)
Mean Plasma Glucose: 111 mg/dL

## 2021-03-08 MED ORDER — POTASSIUM CHLORIDE CRYS ER 20 MEQ PO TBCR
40.0000 meq | EXTENDED_RELEASE_TABLET | Freq: Once | ORAL | Status: AC
  Administered 2021-03-08: 40 meq via ORAL
  Filled 2021-03-08: qty 2

## 2021-03-08 MED ORDER — ZINC OXIDE 40 % EX OINT
TOPICAL_OINTMENT | Freq: Two times a day (BID) | CUTANEOUS | Status: DC
  Administered 2021-03-10 – 2021-03-11 (×2): 1 via TOPICAL
  Filled 2021-03-08 (×2): qty 57

## 2021-03-08 NOTE — Progress Notes (Signed)
Pt alert & oriented, talking frequently today about wanting to go home. Pt yells out even as staff attempts to reposition her in bed. Pt continues to pass from liquid to formed stool. Desitin cream applied to excoriation on buttocks and pt turned on sides to alleviate pressure to buttocks. Drsg change this am to right heel wound. Daughter visit this evening.

## 2021-03-08 NOTE — Consult Note (Signed)
WOC Nurse Consult Note: Patient receiving care in [facility] [phone] Reason for Consult: Bilat heel ulcers Wound type: Bilateral DTPI as well as some skin loss on the right lateral heel. MASD to the lower buttock area. Pressure Injury POA: Yes Measurement: Deferred Wound bed: Black/Purple Drainage (amount, consistency, odor) scant amount of serous drainage from the skin loss area right lateral heel Dressing procedure/placement/frequency: Clean both feet with soap and water, rinse, pat dry. Apply Xeroform gauze [place] # 294) to the right lateral heel. Apply Mepilex heel foam to bilateral heels and place both feet in Prevalon boots.  Cleanse the sacral area with no rinse cleanser pat dry and apply Desitin cream to the entire area twice daily or PRN soiling.  Monitor the wound area(s) for worsening of condition such as: Signs/symptoms of infection, increase in size, development of or worsening of odor, development of pain, or increased pain at the affected locations.   Notify the medical team if any of these develop.  Thank you for the consult. WOC nurse will not follow at this time.   Please re-consult the WOC team if needed.  Renaldo Reel Katrinka Blazing, MSN, RN, CMSRN, Angus Seller, [facility] Wound Treatment Associate Pager [phone]

## 2021-03-08 NOTE — Progress Notes (Addendum)
PROGRESS NOTE    Mayar Whittier  YOK:[id number] DOB: [birth year] DOA: 03/05/2021 PCP: Lajean Manes, MD    Chief Complaint  Patient presents with   Wound Check    Brief Narrative:   Meghann Landing is a 87 y.o. female with medical history significant of HTN, mild dementia presenting with R foot wound.  She was able to walk with a walker until 3 weeks ago.  She had a "friction blister" and they were told to wrap it and not burst it.  They burst the first one she had and it came back "big as an egg" and it burst on its own.  Her daughter noticed it July 15 - her nephew sent her a picture of it.  Her family is concerned about her doing her ADLs - family has been bringing her meals.  She has been unable to walk for the last few weeks.  No fevers.  Her sugar was high when the medics came the other day, about 200 - she does not have known h/o DM.  Assessment & Plan:   Principal Problem:   Sepsis due to undetermined organism [facility]) Active Problems:   Dementia ([place])   Hypertension   Lower extremity ulceration, right, with fat layer exposed ([place])   Stage 3a chronic kidney disease ([place])   Hyperglycemia   Malnutrition ([place])   DNR (do not resuscitate)   Protein-calorie malnutrition, severe    Sepsis due to infected right heel ulcer, with surrounding cellulitis with myofasciitis -Sepsis criteria present on admission, leukocytosis, tachycardia, tachypnea with elevated lactic acid. -Started empirically on IV Rocephin and vancomycin, her MRSA PCR screen is negative, will go ahead and discontinue her vancomycin. -follow-up on blood cultures -MRI significant for cellulitis with myofasciitis, no evidence of osteomyelitis. -Wound care input greatly appreciated, continue with local wound care  Foot Wound -Patient was bilateral ear pressure ulcer, the right got infected. -We will consult wound care -LE wound order set utilized including labs (CRP, ESR, A1c, prealbumin, and blood cultures) and  consults (peripheral vascular navigator; TOC team; wound care; and nutrition)    HTN -Continue Norvasc, hydralazine, HCTZ, Diovan  Hyponatremia -Sodium significantly dropped from 1 41-133, will put on fluid restrictions 1200 cc/h, will check urine sodium and osmolality, if continues to trend down will discontinue hydrochlorothiazide.  Hypokalemia -Repleted   Stage 3a CKD -Appears to be at/near baseline at this time   Hyperglycemia -Will check A1c -If significantly elevated, consider addition of SSI   Malnutrition -Body mass index is 18.51 kg/m.. -The patient has at least 2 indicators for malnutrition (insufficient energy intake, weight loss, loss of muscle mass, loss of subcutaneous fat, edema, diminished functional status -This is likely due to chronic disease -Will obtain a nutrition consult for further recommendations.    Dementia/Goals of care -Family reports some difficulty with ADLs at baseline -Has not been able to walk for the last 3 weeks due to leg weakness -Appears likely to benefit from SNF rehab -[place] discussed and patient prefers aggressive treatment approach -I have discussed code status with the patient and her daughter and  they are in agreement that the patient would not desire resuscitation and would prefer to die a natural death should that situation arise; she will need a gold out of facility DNR form at the time of discharge           DVT prophylaxis: Lovenox Code Status: Full Family Communication: Discussed with grandson at bedside Disposition:   Status is: Inpatient  Remains inpatient  appropriate because:IV treatments appropriate due to intensity of illness or inability to take PO  Dispo: The patient is from: Home              Anticipated d/c is to: SNF              Patient currently is not medically stable to d/c.   Difficult to place patient Yes       Subjective:  She is pleasant, she denies any complaints.  Objective: Vitals:    03/07/21 2309 03/08/21 0415 03/08/21 0743 03/08/21 1205  BP: (!) 160/54 (!) 161/57 (!) 161/57 (!) 109/46  Pulse: 90 96 99 (!) 102  Resp: '19 19 19 20  ' Temp: 98 F (36.7 C) 97.9 F (36.6 C) 97.8 F (36.6 C) 97.9 F (36.6 C)  TempSrc: Axillary Axillary Oral Oral  SpO2: 100% 100% 100% 100%  Weight:        Intake/Output Summary (Last 24 hours) at 03/08/2021 1537 Last data filed at 03/08/2021 0900 Gross per 24 hour  Intake 357 ml  Output --  Net 357 ml   Filed Weights   03/06/21 0852  Weight: 58.5 kg    Examination:  Awake Alert, resident, deconditioned, frail, no apparent distress Symmetrical Chest wall movement, Good air movement bilaterally, CTAB RRR,No Gallops,Rubs or new Murmurs, No Parasternal Heave +ve B.Sounds, Abd Soft, No tenderness, No rebound - guarding or rigidity. No Cyanosis, Clubbing or edema, bilateral feet in floating boots.    Data Reviewed: I have personally reviewed following labs and imaging studies  CBC: Recent Labs  Lab 03/05/21 1607 03/07/21 0059 03/08/21 0050  WBC 19.8* 16.0* 14.2*  NEUTROABS 18.2*  --   --   HGB 14.8 11.7* 12.0  HCT 47.8* 38.2 37.8  MCV 87.1 86.6 84.8  PLT 445* 484* 885    Basic Metabolic Panel: Recent Labs  Lab 03/05/21 1607 03/07/21 0059 03/08/21 0050  NA 139 141 133*  K 3.2* 3.3* 3.3*  CL 96* 101 97*  CO2 30 33* 28  GLUCOSE 183* 160* 144*  BUN '23 19 21  ' CREATININE 0.95 0.80 0.75  CALCIUM 9.7 9.2 8.8*    GFR: Estimated Creatinine Clearance: 40.6 mL/min (by C-G formula based on SCr of 0.75 mg/dL).  Liver Function Tests: Recent Labs  Lab 03/05/21 1607  AST 43*  ALT 37  ALKPHOS 81  BILITOT 0.6  PROT 6.9  ALBUMIN 2.9*    CBG: No results for input(s): GLUCAP in the last 168 hours.   Recent Results (from the past 240 hour(s))  Urine Culture     Status: Abnormal   Collection Time: 03/06/21  8:56 AM   Specimen: Urine, Clean Catch  Result Value Ref Range Status   Specimen Description URINE, CLEAN  CATCH  Final   Special Requests   Final    NONE Performed at [facility], [address], [place], [place] [address]    Culture MULTIPLE SPECIES PRESENT, SUGGEST RECOLLECTION (A)  Final   Report Status 03/08/2021 FINAL  Final  Resp Panel by RT-PCR (Flu A&B, Covid) Nasopharyngeal Swab     Status: None   Collection Time: 03/06/21  9:19 AM   Specimen: Nasopharyngeal Swab; Nasopharyngeal(NP) swabs in vial transport medium  Result Value Ref Range Status   SARS Coronavirus 2 by RT PCR NEGATIVE NEGATIVE Final    Comment: (NOTE) SARS-CoV-2 target nucleic acids are NOT DETECTED.  The SARS-CoV-2 RNA is generally detectable in upper respiratory specimens during the acute phase of infection.  The lowest concentration of SARS-CoV-2 viral copies this assay can detect is 138 copies/mL. A negative result does not preclude SARS-Cov-2 infection and should not be used as the sole basis for treatment or other patient management decisions. A negative result may occur with  improper specimen collection/handling, submission of specimen other than nasopharyngeal swab, presence of viral mutation(s) within the areas targeted by this assay, and inadequate number of viral copies(<138 copies/mL). A negative result must be combined with clinical observations, patient history, and epidemiological information. The expected result is Negative.  Fact Sheet for Patients:  EntrepreneurPulse.com.au  Fact Sheet for Healthcare Providers:  IncredibleEmployment.be  This test is no t yet approved or cleared by the [place] FDA and  has been authorized for detection and/or diagnosis of SARS-CoV-2 by FDA under an Emergency Use Authorization (EUA). This EUA will remain  in effect (meaning this test can be used) for the duration of the COVID-19 declaration under Section 564(b)(1) of the Act, 21 U.S.C.section 360bbb-3(b)(1), unless the authorization is terminated  or revoked  sooner.       Influenza A by PCR NEGATIVE NEGATIVE Final   Influenza B by PCR NEGATIVE NEGATIVE Final    Comment: (NOTE) The Xpert Xpress SARS-CoV-2/FLU/RSV plus assay is intended as an aid in the diagnosis of influenza from Nasopharyngeal swab specimens and should not be used as a sole basis for treatment. Nasal washings and aspirates are unacceptable for Xpert Xpress SARS-CoV-2/FLU/RSV testing.  Fact Sheet for Patients: EntrepreneurPulse.com.au  Fact Sheet for Healthcare Providers: IncredibleEmployment.be  This test is not yet approved or cleared by the [place] FDA and has been authorized for detection and/or diagnosis of SARS-CoV-2 by FDA under an Emergency Use Authorization (EUA). This EUA will remain in effect (meaning this test can be used) for the duration of the COVID-19 declaration under Section 564(b)(1) of the Act, 21 U.S.C. section 360bbb-3(b)(1), unless the authorization is terminated or revoked.  Performed at [facility], [place] [address] [place], [place] [address]   Blood culture (routine x 2)     Status: None (Preliminary result)   Collection Time: 03/06/21  9:25 AM   Specimen: BLOOD  Result Value Ref Range Status   Specimen Description BLOOD SITE NOT SPECIFIED  Final   Special Requests   Final    BOTTLES DRAWN AEROBIC AND ANAEROBIC Blood Culture adequate volume   Culture   Final    NO GROWTH 2 DAYS Performed at [facility], [address], [place], [place] [address]    Report Status PENDING  Incomplete  Blood culture (routine x 2)     Status: None (Preliminary result)   Collection Time: 03/06/21  9:27 AM   Specimen: BLOOD  Result Value Ref Range Status   Specimen Description BLOOD SITE NOT SPECIFIED  Final   Special Requests   Final    BOTTLES DRAWN AEROBIC AND ANAEROBIC Blood Culture results may not be optimal due to an inadequate volume of blood received in culture bottles   Culture   Final     NO GROWTH 2 DAYS Performed at [facility], [place] [address] [place], [place] [address]    Report Status PENDING  Incomplete  MRSA Next Gen by PCR, Nasal     Status: None   Collection Time: 03/08/21 10:40 AM   Specimen: Nasal Mucosa; Nasal Swab  Result Value Ref Range Status   MRSA by PCR Next Gen NOT DETECTED NOT DETECTED Final    Comment: (NOTE)  The GeneXpert MRSA Assay (FDA approved for NASAL specimens only), is one component of a comprehensive MRSA colonization surveillance program. It is not intended to diagnose MRSA infection nor to guide or monitor treatment for MRSA infections. Test performance is not FDA approved in patients less than 8 years old. Performed at [facility], [place] [address], [place], [place] [address]          Radiology Studies: MR HEEL RIGHT W WO CONTRAST  Result Date: 03/07/2021 CLINICAL DATA:  Foot pain and swelling. EXAM: MR OF THE RIGHT HEEL WITHOUT AND WITH CONTRAST TECHNIQUE: Multiplanar, multisequence MR imaging of the right foot was performed both before and after administration of intravenous contrast. CONTRAST:  32m GADAVIST GADOBUTROL 1 MMOL/ML IV SOLN COMPARISON:  Radiographs, same date. FINDINGS: Diffuse subcutaneous soft tissue swelling/edema consistent with cellulitis. There is also diffuse myofasciitis. No discrete rim enhancing soft tissue abscess is identified. No findings to suggest pyomyositis. No MR findings suspicious for septic arthritis or osteomyelitis. The major tendons and ligaments are intact. IMPRESSION: Diffuse cellulitis and myofasciitis but no discrete soft tissue abscess, pyomyositis, septic arthritis or osteomyelitis. Electronically Signed   By: PMarijo SanesM.D.   On: 03/07/2021 05:05   VAS [place]ABI WITH/WO TBI  Result Date: 03/08/2021  LOWER EXTREMITY DOPPLER STUDY Patient Name:  DAYLAH YEARY Date of Exam:   03/07/2021 Medical Rec #: [id number]     Accession #:    [id number] of Birth: [birth year]     Patient  Gender: F Patient Age:   35YExam [facility]  [facility]:      VAS [place]ABI WITH/WO TBI Referring Phys: [id number] JENNIFER YATES --------------------------------------------------------------------------------  Indications: Ulceration.  Comparison Study: No previous exam Performing Technologist: Hill, Jody RVT, RDMS  Examination Guidelines: A complete evaluation includes at minimum, Doppler waveform signals and systolic blood pressure reading at the level of bilateral brachial, anterior tibial, and posterior tibial arteries, when vessel segments are accessible. Bilateral testing is considered an integral part of a complete examination. Photoelectric Plethysmograph (PPG) waveforms and toe systolic pressure readings are included as required and additional duplex testing as needed. Limited examinations for reoccurring indications may be performed as noted.  ABI Findings: +---------+------------------+-----+----------+--------+ Right    Rt Pressure (mmHg)IndexWaveform  Comment  +---------+------------------+-----+----------+--------+ Brachial 139                    triphasic          +---------+------------------+-----+----------+--------+ PTA      161               1.16 monophasic         +---------+------------------+-----+----------+--------+ DP       191               1.37 monophasic         +---------+------------------+-----+----------+--------+ Great Toe63                0.45 Abnormal           +---------+------------------+-----+----------+--------+ +---------+------------------+-----+----------+-------+ Left     Lt Pressure (mmHg)IndexWaveform  Comment +---------+------------------+-----+----------+-------+ Brachial 138                    triphasic         +---------+------------------+-----+----------+-------+ PTA      119               0.86 monophasic        +---------+------------------+-----+----------+-------+ DP  monophasic>254    +---------+------------------+-----+----------+-------+ Great Toe65                0.47 Abnormal          +---------+------------------+-----+----------+-------+ +-------+-----------+-----------+------------+------------+ ABI/TBIToday's ABIToday's TBIPrevious ABIPrevious TBI +-------+-----------+-----------+------------+------------+ Right  1.37       0.45                                +-------+-----------+-----------+------------+------------+ Left   [place]         0.47                                +-------+-----------+-----------+------------+------------+ Arterial wall calcification precludes accurate ankle pressures and ABIs.  Summary: Right: Resting right ankle-brachial index indicates noncompressible right lower extremity arteries. The right toe-brachial index is abnormal and indicates moderate resting ischemia. ABIs are unreliable. RT great toe pressure = 63 mmHg. Left: Resting left ankle-brachial index indicates noncompressible left lower extremity arteries. The left toe-brachial index is abnormal and indicates moderate resting ischemia. ABIs are unreliable. LT Great toe pressure = 65 mmHg.  *See table(s) above for measurements and observations.  Electronically signed by Servando Snare MD on 03/08/2021 at 2:49:55 PM.    Final         Scheduled Meds:  amLODipine  10 mg Oral Daily   docusate sodium  100 mg Oral BID   enoxaparin (LOVENOX) injection  40 mg Subcutaneous Q24H   feeding supplement  237 mL Oral QHS   hydrALAZINE  25 mg Oral TID   hydrochlorothiazide  25 mg Oral Daily   irbesartan  300 mg Oral Daily   liver oil-zinc oxide   Topical BID   LORazepam  1 mg Intravenous Once   multivitamin with minerals  1 tablet Oral Daily   nutrition supplement (JUVEN)  1 packet Oral BID BM   sodium chloride flush  3 mL Intravenous Q12H   Continuous Infusions:  cefTRIAXone (ROCEPHIN)  IV 2 g (03/07/21 1747)   metronidazole 500 mg (03/08/21 1022)     LOS:  2 days       Phillips Climes, MD [facility]   To contact the attending provider between 7A-7P or the covering provider during after hours 7P-7A, please log into the web site [email or web address] and access using universal [place] password for that web site. If you do not have the password, please call the hospital operator.  03/08/2021, 3:37 PM

## 2021-03-08 NOTE — TOC Progression Note (Signed)
Transition of Care [facility]) - Progression Note    Patient Details  Name: Lorren Rossetti MRN: [id number] Date of Birth: Aug 14, 1925  [facility]) CM/SW Contact  Mearl Latin, LCSW Phone Number: 03/08/2021, 3:44 PM  Clinical Narrative:    CSW spoke with patient's daughter, Talbert Forest and provided bed offers. She is going to go tour the facilities but likely will choose [facility] [employer] as her nephew works there as a Engineer, civil (consulting).    Expected Discharge Plan: Skilled Nursing Facility Barriers to Discharge: Continued Medical Work up, English as a second language teacher, SNF Pending bed offer  Expected Discharge Plan and Services Expected Discharge Plan: Skilled Nursing Facility In-house Referral: Clinical Social Work   Post Acute Care Choice: Skilled Nursing Facility Living arrangements for the past 2 months: Single Family Home                                       Social Determinants of Health (SDOH) Interventions    Readmission Risk Interventions No flowsheet data found.

## 2021-03-09 DIAGNOSIS — A419 Sepsis, unspecified organism: Secondary | ICD-10-CM | POA: Diagnosis not present

## 2021-03-09 DIAGNOSIS — E43 Unspecified severe protein-calorie malnutrition: Secondary | ICD-10-CM

## 2021-03-09 DIAGNOSIS — L97912 Non-pressure chronic ulcer of unspecified part of right lower leg with fat layer exposed: Secondary | ICD-10-CM | POA: Diagnosis not present

## 2021-03-09 DIAGNOSIS — R739 Hyperglycemia, unspecified: Secondary | ICD-10-CM | POA: Diagnosis not present

## 2021-03-09 LAB — CBC
HCT: 30.2 % — ABNORMAL LOW (ref 36.0–46.0)
Hemoglobin: 9.4 g/dL — ABNORMAL LOW (ref 12.0–15.0)
MCH: 26.8 pg (ref 26.0–34.0)
MCHC: 31.1 g/dL (ref 30.0–36.0)
MCV: 86 fL (ref 80.0–100.0)
Platelets: 383 10*3/uL (ref 150–400)
RBC: 3.51 MIL/uL — ABNORMAL LOW (ref 3.87–5.11)
RDW: 13.6 % (ref 11.5–15.5)
WBC: 14.4 10*3/uL — ABNORMAL HIGH (ref 4.0–10.5)
nRBC: 0 % (ref 0.0–0.2)

## 2021-03-09 LAB — BASIC METABOLIC PANEL
Anion gap: 7 (ref 5–15)
BUN: 33 mg/dL — ABNORMAL HIGH (ref 8–23)
CO2: 32 mmol/L (ref 22–32)
Calcium: 8.6 mg/dL — ABNORMAL LOW (ref 8.9–10.3)
Chloride: 98 mmol/L (ref 98–111)
Creatinine, Ser: 1.02 mg/dL — ABNORMAL HIGH (ref 0.44–1.00)
GFR, Estimated: 51 mL/min — ABNORMAL LOW (ref >60)
Glucose, Bld: 113 mg/dL — ABNORMAL HIGH (ref 70–99)
Potassium: 3.6 mmol/L (ref 3.5–5.1)
Sodium: 137 mmol/L (ref 135–145)

## 2021-03-09 MED ORDER — ENSURE ENLIVE PO LIQD
237.0000 mL | Freq: Three times a day (TID) | ORAL | Status: DC
  Administered 2021-03-09 – 2021-03-12 (×8): 237 mL via ORAL

## 2021-03-09 MED ORDER — SODIUM CHLORIDE 0.9 % IV SOLN: INTRAVENOUS | Status: DC

## 2021-03-09 NOTE — TOC Progression Note (Addendum)
Transition of Care [facility]) - Progression Note    Patient Details  Name: [name] MRN: [id number] Date of Birth: 1926/05/07  [facility]) CM/SW Contact  Mearl Latin, LCSW Phone Number: 03/09/2021, 9:40 AM  Clinical Narrative:    9:40am-CSW received call from patient's daughter, Talbert Forest. She stated they would like patient to go to [facility] and are requesting a private room. CSW waiting on response from [facility].   12:51pm-Per [facility], they will not be able to accept over the weekend and can accept on Monday and placed on the waitlist for a private room. CSW updated patient's daughter. Clinicals submitted to insurance, Ref # [id number].   Expected Discharge Plan: Skilled Nursing Facility Barriers to Discharge: Continued Medical Work up, English as a second language teacher, SNF Pending bed offer  Expected Discharge Plan and Services Expected Discharge Plan: Skilled Nursing Facility In-house Referral: Clinical Social Work   Post Acute Care Choice: Skilled Nursing Facility Living arrangements for the past 2 months: Single Family Home                                       Social Determinants of Health (SDOH) Interventions    Readmission Risk Interventions No flowsheet data found.

## 2021-03-09 NOTE — Care Management Important Message (Signed)
Important Message  Patient Details  Name: [name] MRN: [id number] Date of Birth: 12/29/1925   Medicare Important Message Given:  Yes     Mardene Sayer 03/09/2021, 5:24 PM

## 2021-03-09 NOTE — Progress Notes (Signed)
PROGRESS NOTE    Arleene Stander  MRN:[id number] DOB: 03/08/1926 DOA: 03/05/2021 PCP: Stoneking, Hal, MD    Chief Complaint  Patient presents with   Wound Check    Brief Narrative:   [name] is a 87 y.o. female with medical history significant of HTN, mild dementia presenting with R foot wound.  She was able to walk with a walker until 3 weeks ago.  She had a "friction blister" and they were told to wrap it and not burst it.  They burst the first one she had and it came back "big as an egg" and it burst on its own.  Her daughter noticed it July 15 - her nephew sent her a picture of it.  Her family is concerned about her doing her ADLs - family has been bringing her meals.  She has been unable to walk for the last few weeks.  No fevers.  Her sugar was high when the medics came the other day, about 200 - she does not have known h/o DM.  Assessment & Plan:   Principal Problem:   Sepsis due to undetermined organism (HCC) Active Problems:   Dementia (HCC)   Hypertension   Lower extremity ulceration, right, with fat layer exposed (HCC)   Stage 3a chronic kidney disease (HCC)   Hyperglycemia   Malnutrition (HCC)   DNR (do not resuscitate)   Protein-calorie malnutrition, severe    Sepsis due to infected right heel ulcer, with surrounding cellulitis with myofasciitis -Sepsis criteria present on admission, leukocytosis, tachycardia, tachypnea with elevated lactic acid. -Started empirically on IV Rocephin and vancomycin, her MRSA PCR screen is negative, vancomycin has been discontinued, hopefully she can be transitioned to oral antibiotics in 1 to 2 days.-follow-up on blood cultures -MRI significant for cellulitis with myofasciitis, no evidence of osteomyelitis. -Wound care input greatly appreciated, continue with local wound care  Foot Wound -Patient was bilateral heel pressure ulcer, the right got infected. -We will consult wound care -LE wound order set utilized including labs  (CRP, ESR, A1c, prealbumin, and blood cultures) and consults (peripheral vascular navigator; TOC team; wound care; and nutrition)    HTN -Patient was continued on Norvasc, hydralazine, HCTZ, Diovan, but her blood pressure was actually noted to be on the lower side, so I have stopped all antihypertensive medications and she was started on IV normal saline 50 cc/h.  Hyponatremia -Sodium significantly dropped from 141-133, this has improved after starting fluid restriction 1200 cc will put on fluid restrictions 1200 cc/h.  Hypokalemia -Repleted   Stage 3a CKD -Appears to be at/near baseline at this time   Hyperglycemia -A1c is 5.5   Severe protein calorie malnutrition -Body mass index is 18.51 kg/m.. -The patient has at least 2 indicators for malnutrition (insufficient energy intake, weight loss, loss of muscle mass, loss of subcutaneous fat, edema, diminished functional status -Duration is consult appreciated   Dementia/Goals of care -Family reports some difficulty with ADLs at baseline -Has not been able to walk for the last 3 weeks due to leg weakness -Appears likely to benefit from SNF rehab -GOC discussed and patient prefers aggressive treatment approach -I have discussed code status with the patient and her daughter and  they are in agreement that the patient would not desire resuscitation and would prefer to die a natural death should that situation arise; she will need a gold out of facility DNR form at the time of discharge           DVT prophylaxis:   Lovenox Code Status: Full Family Communication: Discussed with daughter at bedside Disposition:   Status is: Inpatient  Remains inpatient appropriate because:IV treatments appropriate due to intensity of illness or inability to take PO  Dispo: The patient is from: Home              Anticipated d/c is to: SNF              Patient currently is not medically stable to d/c.   Difficult to place patient  Yes       Subjective:  She is pleasant, she denies any complaints.  Objective: Vitals:   03/08/21 2356 03/09/21 0411 03/09/21 0744 03/09/21 1156  BP: (!) 101/39 (!) 96/46 (!) 107/48 (!) 142/52  Pulse: 93 72 80 92  Resp: 18 17 19 19  Temp: 98.5 F (36.9 C) 98.6 F (37 C) 98.1 F (36.7 C) 98.1 F (36.7 C)  TempSrc: Axillary Axillary Oral Oral  SpO2: 100% 99% 100% 100%  Weight:        Intake/Output Summary (Last 24 hours) at 03/09/2021 1553 Last data filed at 03/09/2021 0936 Gross per 24 hour  Intake 714 ml  Output 725 ml  Net -11 ml   Filed Weights   03/06/21 0852  Weight: 58.5 kg    Examination:  Awake Alert,, frail and deconditioned  symmetrical Chest wall movement, Good air movement bilaterally, CTAB RRR,No Gallops,Rubs or new Murmurs, No Parasternal Heave +ve B.Sounds, Abd Soft, No tenderness, No rebound - guarding or rigidity. No Cyanosis, Clubbing or edema, bilateral feet in floating boots.    Data Reviewed: I have personally reviewed following labs and imaging studies  CBC: Recent Labs  Lab 03/05/21 1607 03/07/21 0059 03/08/21 0050 03/09/21 0145  WBC 19.8* 16.0* 14.2* 14.4*  NEUTROABS 18.2*  --   --   --   HGB 14.8 11.7* 12.0 9.4*  HCT 47.8* 38.2 37.8 30.2*  MCV 87.1 86.6 84.8 86.0  PLT 445* 484* 336 383    Basic Metabolic Panel: Recent Labs  Lab 03/05/21 1607 03/07/21 0059 03/08/21 0050 03/09/21 0145  NA 139 141 133* 137  K 3.2* 3.3* 3.3* 3.6  CL 96* 101 97* 98  CO2 30 33* 28 32  GLUCOSE 183* 160* 144* 113*  BUN 23 19 21 33*  CREATININE 0.95 0.80 0.75 1.02*  CALCIUM 9.7 9.2 8.8* 8.6*    GFR: Estimated Creatinine Clearance: 31.8 mL/min (A) (by C-G formula based on SCr of 1.02 mg/dL (H)).  Liver Function Tests: Recent Labs  Lab 03/05/21 1607  AST 43*  ALT 37  ALKPHOS 81  BILITOT 0.6  PROT 6.9  ALBUMIN 2.9*    CBG: No results for input(s): GLUCAP in the last 168 hours.   Recent Results (from the past 240 hour(s))   Urine Culture     Status: Abnormal   Collection Time: 03/06/21  8:56 AM   Specimen: Urine, Clean Catch  Result Value Ref Range Status   Specimen Description URINE, CLEAN CATCH  Final   Special Requests   Final    NONE Performed at [facility], [address] [place], [place] [address]    Culture MULTIPLE SPECIES PRESENT, SUGGEST RECOLLECTION (A)  Final   Report Status 03/08/2021 FINAL  Final  Resp Panel by RT-PCR (Flu A&B, Covid) Nasopharyngeal Swab     Status: None   Collection Time: 03/06/21  9:19 AM   Specimen: Nasopharyngeal Swab; Nasopharyngeal(NP) swabs in vial transport medium  Result Value Ref Range Status     SARS Coronavirus 2 by RT PCR NEGATIVE NEGATIVE Final    Comment: (NOTE) SARS-CoV-2 target nucleic acids are NOT DETECTED.  The SARS-CoV-2 RNA is generally detectable in upper respiratory specimens during the acute phase of infection. The lowest concentration of SARS-CoV-2 viral copies this assay can detect is 138 copies/mL. A negative result does not preclude SARS-Cov-2 infection and should not be used as the sole basis for treatment or other patient management decisions. A negative result may occur with  improper specimen collection/handling, submission of specimen other than nasopharyngeal swab, presence of viral mutation(s) within the areas targeted by this assay, and inadequate number of viral copies(<138 copies/mL). A negative result must be combined with clinical observations, patient history, and epidemiological information. The expected result is Negative.  Fact Sheet for Patients:  EntrepreneurPulse.com.au  Fact Sheet for Healthcare Providers:  IncredibleEmployment.be  This test is no t yet approved or cleared by the [place] FDA and  has been authorized for detection and/or diagnosis of SARS-CoV-2 by FDA under an Emergency Use Authorization (EUA). This EUA will remain  in effect (meaning this test can be  used) for the duration of the COVID-19 declaration under Section 564(b)(1) of the Act, 21 U.S.C.section 360bbb-3(b)(1), unless the authorization is terminated  or revoked sooner.       Influenza A by PCR NEGATIVE NEGATIVE Final   Influenza B by PCR NEGATIVE NEGATIVE Final    Comment: (NOTE) The Xpert Xpress SARS-CoV-2/FLU/RSV plus assay is intended as an aid in the diagnosis of influenza from Nasopharyngeal swab specimens and should not be used as a sole basis for treatment. Nasal washings and aspirates are unacceptable for Xpert Xpress SARS-CoV-2/FLU/RSV testing.  Fact Sheet for Patients: EntrepreneurPulse.com.au  Fact Sheet for Healthcare Providers: IncredibleEmployment.be  This test is not yet approved or cleared by the [place] FDA and has been authorized for detection and/or diagnosis of SARS-CoV-2 by FDA under an Emergency Use Authorization (EUA). This EUA will remain in effect (meaning this test can be used) for the duration of the COVID-19 declaration under Section 564(b)(1) of the Act, 21 U.S.C. section 360bbb-3(b)(1), unless the authorization is terminated or revoked.  Performed at [facility], [place] [address], [place], [place] [address]   Blood culture (routine x 2)     Status: None (Preliminary result)   Collection Time: 03/06/21  9:25 AM   Specimen: BLOOD  Result Value Ref Range Status   Specimen Description BLOOD SITE NOT SPECIFIED  Final   Special Requests   Final    BOTTLES DRAWN AEROBIC AND ANAEROBIC Blood Culture adequate volume   Culture   Final    NO GROWTH 2 DAYS Performed at [facility], [address], [place], [place] [address]    Report Status PENDING  Incomplete  Blood culture (routine x 2)     Status: None (Preliminary result)   Collection Time: 03/06/21  9:27 AM   Specimen: BLOOD  Result Value Ref Range Status   Specimen Description BLOOD SITE NOT SPECIFIED  Final   Special Requests    Final    BOTTLES DRAWN AEROBIC AND ANAEROBIC Blood Culture results may not be optimal due to an inadequate volume of blood received in culture bottles   Culture   Final    NO GROWTH 2 DAYS Performed at [facility], [place] [address] [place], [place] [address]    Report Status PENDING  Incomplete  MRSA Next Gen by PCR, Nasal     Status:  None   Collection Time: 03/08/21 10:40 AM   Specimen: Nasal Mucosa; Nasal Swab  Result Value Ref Range Status   MRSA by PCR Next Gen NOT DETECTED NOT DETECTED Final    Comment: (NOTE) The GeneXpert MRSA Assay (FDA approved for NASAL specimens only), is one component of a comprehensive MRSA colonization surveillance program. It is not intended to diagnose MRSA infection nor to guide or monitor treatment for MRSA infections. Test performance is not FDA approved in patients less than 40 years old. Performed at [facility], [place] [address], [place], [place] [address]          Radiology Studies: VAS [place] ABI WITH/WO TBI  Result Date: 03/08/2021  LOWER EXTREMITY DOPPLER STUDY Patient Name:  KATIANNE BARRE  Date of Exam:   03/07/2021 Medical Rec #: [id number]      Accession #:    [id number] Date of Birth: 10/24/1925      Patient Gender: F Patient Age:   55Y Exam Location:  [facility] Procedure:      VAS [place] ABI WITH/WO TBI Referring Phys: [id number] JENNIFER YATES --------------------------------------------------------------------------------  Indications: Ulceration.  Comparison Study: No previous exam Performing Technologist: Hill, Jody RVT, RDMS  Examination Guidelines: A complete evaluation includes at minimum, Doppler waveform signals and systolic blood pressure reading at the level of bilateral brachial, anterior tibial, and posterior tibial arteries, when vessel segments are accessible. Bilateral testing is considered an integral part of a complete examination. Photoelectric Plethysmograph (PPG) waveforms and toe systolic pressure readings are  included as required and additional duplex testing as needed. Limited examinations for reoccurring indications may be performed as noted.  ABI Findings: +---------+------------------+-----+----------+--------+ Right    Rt Pressure (mmHg)IndexWaveform  Comment  +---------+------------------+-----+----------+--------+ Brachial 139                    triphasic          +---------+------------------+-----+----------+--------+ PTA      161               1.16 monophasic         +---------+------------------+-----+----------+--------+ DP       191               1.37 monophasic         +---------+------------------+-----+----------+--------+ Great Toe63                0.45 Abnormal           +---------+------------------+-----+----------+--------+ +---------+------------------+-----+----------+-------+ Left     Lt Pressure (mmHg)IndexWaveform  Comment +---------+------------------+-----+----------+-------+ Brachial 138                    triphasic         +---------+------------------+-----+----------+-------+ PTA      119               0.86 monophasic        +---------+------------------+-----+----------+-------+ DP                              monophasic>254    +---------+------------------+-----+----------+-------+ Great Toe65                0.47 Abnormal          +---------+------------------+-----+----------+-------+ +-------+-----------+-----------+------------+------------+ ABI/TBIToday's ABIToday's TBIPrevious ABIPrevious TBI +-------+-----------+-----------+------------+------------+ Right  1.37       0.45                                +-------+-----------+-----------+------------+------------+  Left            0.47                                +-------+-----------+-----------+------------+------------+ Arterial wall calcification precludes accurate ankle pressures and ABIs.  Summary: Right: Resting right ankle-brachial index  indicates noncompressible right lower extremity arteries. The right toe-brachial index is abnormal and indicates moderate resting ischemia. ABIs are unreliable. RT great toe pressure = 63 mmHg. Left: Resting left ankle-brachial index indicates noncompressible left lower extremity arteries. The left toe-brachial index is abnormal and indicates moderate resting ischemia. ABIs are unreliable. LT Great toe pressure = 65 mmHg.  *See table(s) above for measurements and observations.  Electronically signed by Brandon Cain MD on 03/08/2021 at 2:49:55 PM.    Final         Scheduled Meds:  docusate sodium  100 mg Oral BID   enoxaparin (LOVENOX) injection  40 mg Subcutaneous Q24H   feeding supplement  237 mL Oral TID BM   liver oil-zinc oxide   Topical BID   LORazepam  1 mg Intravenous Once   multivitamin with minerals  1 tablet Oral Daily   sodium chloride flush  3 mL Intravenous Q12H   Continuous Infusions:  sodium chloride 50 mL/hr at 03/09/21 1452   cefTRIAXone (ROCEPHIN)  IV 2 g (03/08/21 1706)   metronidazole 500 mg (03/09/21 1109)     LOS: 3 days        , MD [facility]   To contact the attending provider between 7A-7P or the covering provider during after hours 7P-7A, please log into the web site [email or web address] and access using universal [place] password for that web site. If you do not have the password, please call the hospital operator.  03/09/2021, 3:53 PM    

## 2021-03-09 NOTE — Progress Notes (Signed)
Nutrition Follow-up  DOCUMENTATION CODES:   Severe malnutrition in context of chronic illness  INTERVENTION:   -D/c 1 packet Juven BID, each packet provides 95 calories, 2.5 grams of protein (collagen), and 9.8 grams of carbohydrate (3 grams sugar); also contains 7 grams of L-arginine and L-glutamine, 300 mg vitamin C, 15 mg vitamin E, 1.2 mcg vitamin B-12, 9.5 mg zinc, 200 mg calcium, and 1.5 g  Calcium Beta-hydroxy-Beta-methylbutyrate to support wound healing  -Ensure Enlive po TID, each supplement provides 350 kcal and 20 grams of protein  -Magic cup TID with meals, each supplement provides 290 kcal and 9 grams of protein  -MVI with minerals daily  NUTRITION DIAGNOSIS:   Severe Malnutrition related to chronic illness (dementia) as evidenced by moderate fat depletion, severe fat depletion, moderate muscle depletion, severe muscle depletion.  Ongoing  GOAL:   Patient will meet greater than or equal to 90% of their needs  Progressing   MONITOR:   PO intake, Supplement acceptance, Labs, Weight trends, Skin, I & O's  REASON FOR ASSESSMENT:   Consult Assessment of nutrition requirement/status  ASSESSMENT:   [name] is a 87 y.o. female with medical history significant of HTN, mild dementia presenting with R foot wound.  She was able to walk with a walker until 3 weeks ago.  She had a "friction blister" and they were told to wrap it and not burst it.  They burst the first one she had and it came back "big as an egg" and it burst on its own.  Her daughter noticed it July 15 - her nephew sent her a picture of it.  Her family is concerned about her doing her ADLs - family has been bringing her meals.  She has been unable to walk for the last few weeks.  No fevers.  Her sugar was high when the medics came the other day, about 200 - she does not have known h/o DM.  Reviewed I/O's: +67 ml x 24 hours and -213 ml since admission  UOP: 650 ml x 24 hours  Pt sleeping soundly at time  of visit. Pt's intake has declined since last visit. Noted meal completion 0-10%. Pt is consuming Juven supplements.   Due to poor oral intake and malnutrition, pt would benefit from supplement with increased nutrient density. RD would not recommend feeding tube placement secondary to advanced age and dementia.   Medications reviewed and include colace.   Labs reviewed.   Diet Order:   Diet Order             Diet regular Room service appropriate? Yes; Fluid consistency: Thin; Fluid restriction: 1500 mL Fluid  Diet effective now                   EDUCATION NEEDS:   Education needs have been addressed  Skin:  Skin Assessment: Skin Integrity Issues: Skin Integrity Issues:: DTI DTI: bilateral heels Diabetic Ulcer: - Other: skin loss to rt lateral heel, MAS to buttocks  Last BM:  03/09/21  Height:   Ht Readings from Last 1 Encounters:  02/14/21 5\' 10"  (1.778 m)    Weight:   Wt Readings from Last 1 Encounters:  03/06/21 58.5 kg    Ideal Body Weight:  75.5 kg  BMI:  Body mass index is 18.51 kg/m.  Estimated Nutritional Needs:   Kcal:  1750-1950  Protein:  90-105 grams  Fluid:  > 1.7 L    03/08/21, RD, LDN, CDCES Registered Dietitian II Certified  Diabetes Care and Education Specialist Please refer to [facility] for RD and/or RD on-call/weekend/after hours pager

## 2021-03-10 DIAGNOSIS — L03115 Cellulitis of right lower limb: Secondary | ICD-10-CM | POA: Diagnosis not present

## 2021-03-10 DIAGNOSIS — I10 Essential (primary) hypertension: Secondary | ICD-10-CM

## 2021-03-10 DIAGNOSIS — A419 Sepsis, unspecified organism: Secondary | ICD-10-CM | POA: Diagnosis not present

## 2021-03-10 LAB — BASIC METABOLIC PANEL
Anion gap: 7 (ref 5–15)
BUN: 26 mg/dL — ABNORMAL HIGH (ref 8–23)
CO2: 32 mmol/L (ref 22–32)
Calcium: 8.3 mg/dL — ABNORMAL LOW (ref 8.9–10.3)
Chloride: 98 mmol/L (ref 98–111)
Creatinine, Ser: 0.77 mg/dL (ref 0.44–1.00)
GFR, Estimated: >60 mL/min (ref >60)
Glucose, Bld: 90 mg/dL (ref 70–99)
Potassium: 3.5 mmol/L (ref 3.5–5.1)
Sodium: 137 mmol/L (ref 135–145)

## 2021-03-10 LAB — CBC
HCT: 31 % — ABNORMAL LOW (ref 36.0–46.0)
Hemoglobin: 9.5 g/dL — ABNORMAL LOW (ref 12.0–15.0)
MCH: 26.4 pg (ref 26.0–34.0)
MCHC: 30.6 g/dL (ref 30.0–36.0)
MCV: 86.1 fL (ref 80.0–100.0)
Platelets: 456 10*3/uL — ABNORMAL HIGH (ref 150–400)
RBC: 3.6 MIL/uL — ABNORMAL LOW (ref 3.87–5.11)
RDW: 13.4 % (ref 11.5–15.5)
WBC: 13.6 10*3/uL — ABNORMAL HIGH (ref 4.0–10.5)
nRBC: 0 % (ref 0.0–0.2)

## 2021-03-10 NOTE — Progress Notes (Signed)
PROGRESS NOTE    [name]  EHM:[id number] DOB: 12/13/1925 DOA: 03/05/2021 PCP: Lajean Manes, MD    Chief Complaint  Patient presents with   Wound Check    Brief Narrative:   [name] is a 87 y.o. female with medical history significant of HTN, mild dementia presenting with R foot wound.  She was able to walk with a walker until 3 weeks ago.  She had a "friction blister" and they were told to wrap it and not burst it.  They burst the first one she had and it came back "big as an egg" and it burst on its own.  Her daughter noticed it July 15 - her nephew sent her a picture of it.  Her family is concerned about her doing her ADLs - family has been bringing her meals.  She has been unable to walk for the last few weeks.  No fevers.  Her sugar was high when the medics came the other day, about 200 - she does not have known h/o DM.  Assessment & Plan:   Principal Problem:   Sepsis due to undetermined organism [facility]) Active Problems:   Dementia ([place])   Hypertension   Lower extremity ulceration, right, with fat layer exposed ([place])   Stage 3a chronic kidney disease ([place])   Hyperglycemia   Malnutrition ([place])   DNR (do not resuscitate)   Protein-calorie malnutrition, severe    Sepsis due to infected right heel ulcer, with surrounding cellulitis with myofasciitis -Sepsis criteria present on admission, leukocytosis, tachycardia, tachypnea with elevated lactic acid. -Started empirically on IV Rocephin and vancomycin, her MRSA PCR screen is negative, vancomycin has been discontinued, hopefully she can be transitioned to oral antibiotics in 1 to 2 days. -Blood culture remains with no growth to date. -MRI significant for cellulitis with myofasciitis, no evidence of osteomyelitis. -Wound care input greatly appreciated, continue with local wound care  Foot Wound -Patient was bilateral heel pressure ulcer, the right got infected. -We will consult wound care -LE wound order set  utilized including labs (CRP, ESR, A1c, prealbumin, and blood cultures) and consults (peripheral vascular navigator; TOC team; wound care; and nutrition)    HTN -Patient was continued on Norvasc, hydralazine, HCTZ, Diovan, but her blood pressure was actually noted to be on the lower side, so I have stopped all antihypertensive medications and she was started on IV normal saline 50 cc/h.  Pressure is acceptable today, so I will discontinue her IV fluids and continue to hold her meds.  Hyponatremia -Sodium significantly dropped from 141-133, this has improved after starting fluid restriction 1200 cc will put on fluid restrictions 1200 cc/h.  Hypokalemia -Repleted   Stage 3a CKD -Appears to be at/near baseline at this time   Hyperglycemia -A1c is 5.5   Severe protein calorie malnutrition -Body mass index is 18.51 kg/m.. -The patient has at least 2 indicators for malnutrition (insufficient energy intake, weight loss, loss of muscle mass, loss of subcutaneous fat, edema, diminished functional status -Duration is consult appreciated   Dementia/Goals of care -Family reports some difficulty with ADLs at baseline -Has not been able to walk for the last 3 weeks due to leg weakness -Appears likely to benefit from SNF rehab -GOC discussed and patient prefers aggressive treatment approach -I have discussed code status with the patient and her daughter and  they are in agreement that the patient would not desire resuscitation and would prefer to die a natural death should that situation arise; she will need a  gold out of facility DNR form at the time of discharge           DVT prophylaxis: Lovenox Code Status: Full Family Communication: Discussed with niece at bedside Disposition:   Status is: Inpatient  Remains inpatient appropriate because:IV treatments appropriate due to intensity of illness or inability to take PO  Dispo: The patient is from: Home              Anticipated d/c is  to: SNF              Patient currently is not medically stable to d/c.   Difficult to place patient Yes       Subjective:  She is pleasant, she denies any complaints.  Objective: Vitals:   03/09/21 2349 03/10/21 0400 03/10/21 0746 03/10/21 1204  BP: (!) 120/46 (!) 112/46 (!) 134/59 (!) 142/63  Pulse: 100 83 89 97  Resp: '17 14 16 16  ' Temp: 98 F (36.7 C) 97.8 F (36.6 C) 97.9 F (36.6 C) 97.7 F (36.5 C)  TempSrc: Oral Oral Oral Oral  SpO2: 100% 100% 100% 100%  Weight:  55.8 kg      Intake/Output Summary (Last 24 hours) at 03/10/2021 1426 Last data filed at 03/10/2021 0441 Gross per 24 hour  Intake 240 ml  Output 400 ml  Net -160 ml   Filed Weights   03/06/21 0852 03/10/21 0400  Weight: 58.5 kg 55.8 kg    Examination:  Awake Alert, frail, deconditioned, pleasant, demented Symmetrical Chest wall movement, Good air movement bilaterally, CTAB RRR,No Gallops,Rubs or new Murmurs, No Parasternal Heave +ve B.Sounds, Abd Soft, No tenderness, No rebound - guarding or rigidity. No Cyanosis, Clubbing or edema, bilateral feet in floating boots.    Data Reviewed: I have personally reviewed following labs and imaging studies  CBC: Recent Labs  Lab 03/05/21 1607 03/07/21 0059 03/08/21 0050 03/09/21 0145 03/10/21 0125  WBC 19.8* 16.0* 14.2* 14.4* 13.6*  NEUTROABS 18.2*  --   --   --   --   HGB 14.8 11.7* 12.0 9.4* 9.5*  HCT 47.8* 38.2 37.8 30.2* 31.0*  MCV 87.1 86.6 84.8 86.0 86.1  PLT 445* 484* 336 383 456*    Basic Metabolic Panel: Recent Labs  Lab 03/05/21 1607 03/07/21 0059 03/08/21 0050 03/09/21 0145 03/10/21 0125  NA 139 141 133* 137 137  K 3.2* 3.3* 3.3* 3.6 3.5  CL 96* 101 97* 98 98  CO2 30 33* 28 32 32  GLUCOSE 183* 160* 144* 113* 90  BUN '23 19 21 ' 33* 26*  CREATININE 0.95 0.80 0.75 1.02* 0.77  CALCIUM 9.7 9.2 8.8* 8.6* 8.3*    GFR: Estimated Creatinine Clearance: 38.7 mL/min (by C-G formula based on SCr of 0.77 mg/dL).  Liver Function  Tests: Recent Labs  Lab 03/05/21 1607  AST 43*  ALT 37  ALKPHOS 81  BILITOT 0.6  PROT 6.9  ALBUMIN 2.9*    CBG: No results for input(s): GLUCAP in the last 168 hours.   Recent Results (from the past 240 hour(s))  Urine Culture     Status: Abnormal   Collection Time: 03/06/21  8:56 AM   Specimen: Urine, Clean Catch  Result Value Ref Range Status   Specimen Description URINE, CLEAN CATCH  Final   Special Requests   Final    NONE Performed at [facility], [address] [place], [place] [address]    Culture MULTIPLE SPECIES PRESENT, SUGGEST RECOLLECTION (A)  Final   Report  Status 03/08/2021 FINAL  Final  Resp Panel by RT-PCR (Flu A&B, Covid) Nasopharyngeal Swab     Status: None   Collection Time: 03/06/21  9:19 AM   Specimen: Nasopharyngeal Swab; Nasopharyngeal(NP) swabs in vial transport medium  Result Value Ref Range Status   SARS Coronavirus 2 by RT PCR NEGATIVE NEGATIVE Final    Comment: (NOTE) SARS-CoV-2 target nucleic acids are NOT DETECTED.  The SARS-CoV-2 RNA is generally detectable in upper respiratory specimens during the acute phase of infection. The lowest concentration of SARS-CoV-2 viral copies this assay can detect is 138 copies/mL. A negative result does not preclude SARS-Cov-2 infection and should not be used as the sole basis for treatment or other patient management decisions. A negative result may occur with  improper specimen collection/handling, submission of specimen other than nasopharyngeal swab, presence of viral mutation(s) within the areas targeted by this assay, and inadequate number of viral copies(<138 copies/mL). A negative result must be combined with clinical observations, patient history, and epidemiological information. The expected result is Negative.  Fact Sheet for Patients:  EntrepreneurPulse.com.au  Fact Sheet for Healthcare Providers:  IncredibleEmployment.be  This test is no t yet  approved or cleared by the [place] FDA and  has been authorized for detection and/or diagnosis of SARS-CoV-2 by FDA under an Emergency Use Authorization (EUA). This EUA will remain  in effect (meaning this test can be used) for the duration of the COVID-19 declaration under Section 564(b)(1) of the Act, 21 U.S.C.section 360bbb-3(b)(1), unless the authorization is terminated  or revoked sooner.       Influenza A by PCR NEGATIVE NEGATIVE Final   Influenza B by PCR NEGATIVE NEGATIVE Final    Comment: (NOTE) The Xpert Xpress SARS-CoV-2/FLU/RSV plus assay is intended as an aid in the diagnosis of influenza from Nasopharyngeal swab specimens and should not be used as a sole basis for treatment. Nasal washings and aspirates are unacceptable for Xpert Xpress SARS-CoV-2/FLU/RSV testing.  Fact Sheet for Patients: EntrepreneurPulse.com.au  Fact Sheet for Healthcare Providers: IncredibleEmployment.be  This test is not yet approved or cleared by the [place] FDA and has been authorized for detection and/or diagnosis of SARS-CoV-2 by FDA under an Emergency Use Authorization (EUA). This EUA will remain in effect (meaning this test can be used) for the duration of the COVID-19 declaration under Section 564(b)(1) of the Act, 21 U.S.C. section 360bbb-3(b)(1), unless the authorization is terminated or revoked.  Performed at [facility], [place] [address], [place], [place] [address]   Blood culture (routine x 2)     Status: None (Preliminary result)   Collection Time: 03/06/21  9:25 AM   Specimen: BLOOD  Result Value Ref Range Status   Specimen Description BLOOD SITE NOT SPECIFIED  Final   Special Requests   Final    BOTTLES DRAWN AEROBIC AND ANAEROBIC Blood Culture adequate volume   Culture   Final    NO GROWTH 2 DAYS Performed at [facility], [address], [place], [place] [address]    Report Status PENDING  Incomplete  Blood  culture (routine x 2)     Status: None (Preliminary result)   Collection Time: 03/06/21  9:27 AM   Specimen: BLOOD  Result Value Ref Range Status   Specimen Description BLOOD SITE NOT SPECIFIED  Final   Special Requests   Final    BOTTLES DRAWN AEROBIC AND ANAEROBIC Blood Culture results may not be optimal due to an inadequate volume of blood received in  culture bottles   Culture   Final    NO GROWTH 2 DAYS Performed at [facility], [place] [address] [place], [place] [address]    Report Status PENDING  Incomplete  MRSA Next Gen by PCR, Nasal     Status: None   Collection Time: 03/08/21 10:40 AM   Specimen: Nasal Mucosa; Nasal Swab  Result Value Ref Range Status   MRSA by PCR Next Gen NOT DETECTED NOT DETECTED Final    Comment: (NOTE) The GeneXpert MRSA Assay (FDA approved for NASAL specimens only), is one component of a comprehensive MRSA colonization surveillance program. It is not intended to diagnose MRSA infection nor to guide or monitor treatment for MRSA infections. Test performance is not FDA approved in patients less than 105 years old. Performed at [facility], [place] [address], [place], [place] [address]          Radiology Studies: No results found.      Scheduled Meds:  docusate sodium  100 mg Oral BID   enoxaparin (LOVENOX) injection  40 mg Subcutaneous Q24H   feeding supplement  237 mL Oral TID BM   liver oil-zinc oxide   Topical BID   LORazepam  1 mg Intravenous Once   multivitamin with minerals  1 tablet Oral Daily   sodium chloride flush  3 mL Intravenous Q12H   Continuous Infusions:  cefTRIAXone (ROCEPHIN)  IV 2 g (03/09/21 1716)   metronidazole 500 mg (03/10/21 1011)     LOS: 4 days       Phillips Climes, MD [facility]   To contact the attending provider between 7A-7P or the covering provider during after hours 7P-7A, please log into the web site [email or web address] and access using universal [place] password for that  web site. If you do not have the password, please call the hospital operator.  03/10/2021, 2:26 PM

## 2021-03-11 DIAGNOSIS — A419 Sepsis, unspecified organism: Secondary | ICD-10-CM | POA: Diagnosis not present

## 2021-03-11 DIAGNOSIS — L97912 Non-pressure chronic ulcer of unspecified part of right lower leg with fat layer exposed: Secondary | ICD-10-CM | POA: Diagnosis not present

## 2021-03-11 LAB — CULTURE, BLOOD (ROUTINE X 2)
Culture: NO GROWTH
Culture: NO GROWTH
Special Requests: ADEQUATE

## 2021-03-11 LAB — SARS CORONAVIRUS 2 (TAT 6-24 HRS): SARS Coronavirus 2: NEGATIVE

## 2021-03-11 MED ORDER — CEPHALEXIN 250 MG PO CAPS
250.0000 mg | ORAL_CAPSULE | Freq: Four times a day (QID) | ORAL | Status: DC
  Administered 2021-03-11 – 2021-03-12 (×5): 250 mg via ORAL
  Filled 2021-03-11 (×8): qty 1

## 2021-03-11 NOTE — Progress Notes (Signed)
PROGRESS NOTE    [name]  EZM:[id number] DOB: 18-Oct-1925 DOA: 03/05/2021 PCP: Kathryn Manes, MD    Chief Complaint  Patient presents with   Wound Check    Brief Narrative:   [name] is a 87 y.o. female with medical history significant of HTN, mild dementia presenting with R foot wound.  She was able to walk with a walker until 3 weeks ago.  She had a "friction blister" and they were told to wrap it and not burst it.  They burst the first one she had and it came back "big as an egg" and it burst on its own.  Her daughter noticed it July 15 - her nephew sent her a picture of it.  Her family is concerned about her doing her ADLs - family has been bringing her meals.  She has been unable to walk for the last few weeks.  No fevers.  Her sugar was high when the medics came the other day, about 200 - she does not have known h/o DM.  Assessment & Plan:   Principal Problem:   Sepsis due to undetermined organism [facility]) Active Problems:   Dementia ([place])   Hypertension   Lower extremity ulceration, right, with fat layer exposed ([place])   Stage 3a chronic kidney disease ([place])   Hyperglycemia   Malnutrition ([place])   DNR (do not resuscitate)   Protein-calorie malnutrition, severe    Sepsis due to infected right heel ulcer, with surrounding cellulitis with myofasciitis -Sepsis criteria present on admission, leukocytosis, tachycardia, tachypnea with elevated lactic acid. -Started empirically on IV Rocephin and vancomycin, her MRSA PCR screen is negative, vancomycin has been discontinued, will transition to po keflex. -Blood culture remains with no growth to date. -MRI significant for cellulitis with myofasciitis, no evidence of osteomyelitis. -Wound care input greatly appreciated, continue with local wound care  Foot Wound -Patient was bilateral heel pressure ulcer, the right got infected. -We will consult wound care -LE wound order set utilized including labs (CRP, ESR, A1c,  prealbumin, and blood cultures) and consults (peripheral vascular navigator; TOC team; wound care; and nutrition)    HTN -Patient was continued on Norvasc, hydralazine, HCTZ, Diovan, but her blood pressure was actually noted to be on the lower side, so I have stopped all antihypertensive medications and she was started on IV normal saline 50 cc/h.  Pressure is acceptable today, so I will discontinue her IV fluids and continue to hold her meds.  Hyponatremia -Sodium significantly dropped from 141-133, this has improved after starting fluid restriction 1200 cc will put on fluid restrictions 1200 cc/h.  Hypokalemia -Repleted   Stage 3a CKD -Appears to be at/near baseline at this time   Hyperglycemia -A1c is 5.5   Severe protein calorie malnutrition -Body mass index is 18.51 kg/m.. -The patient has at least 2 indicators for malnutrition (insufficient energy intake, weight loss, loss of muscle mass, loss of subcutaneous fat, edema, diminished functional status -Duration is consult appreciated   Dementia/Goals of care -Family reports some difficulty with ADLs at baseline -Has not been able to walk for the last 3 weeks due to leg weakness -Appears likely to benefit from SNF rehab -GOC discussed and patient prefers aggressive treatment approach -I have discussed code status with the patient and her daughter and  they are in agreement that the patient would not desire resuscitation and would prefer to die a natural death should that situation arise; she will need a gold out of facility DNR form at the  time of discharge           DVT prophylaxis: Lovenox Code Status: Full Family Communication: Discussed with niece at bedside Disposition:   Status is: Inpatient  Remains inpatient appropriate because:IV treatments appropriate due to intensity of illness or inability to take PO  Dispo: The patient is from: Home              Anticipated d/c is to: SNF              Patient currently  is medically stable to d/c. Awaiting SNF bed availability   Difficult to place patient Yes       Subjective:  She is pleasant, she denies any complaints.  Objective: Vitals:   03/11/21 0741 03/11/21 0800 03/11/21 0900 03/11/21 1205  BP: (!) 127/55 (!) 115/48  (!) 130/40  Pulse: 85 80  88  Resp: 17   15  Temp: 97.9 F (36.6 C)   98 F (36.7 C)  TempSrc: Oral   Oral  SpO2: 98% 96%  98%  Weight:   55.4 kg   Height:   '5\' 10"'  (1.778 m)     Intake/Output Summary (Last 24 hours) at 03/11/2021 1519 Last data filed at 03/11/2021 1100 Gross per 24 hour  Intake 2399 ml  Output 300 ml  Net 2099 ml   Filed Weights   03/06/21 0852 03/10/21 0400 03/11/21 0900  Weight: 58.5 kg 55.8 kg 55.4 kg    Examination:  Awake Alert, frail, deconditioned, pleasant, demented CTA B/L RRR,No Gallops,Rubs or new Murmurs, No Parasternal Heave +ve B.Sounds, Abd Soft, No tenderness, No rebound - guarding or rigidity. No Cyanosis, Clubbing or edema, bilateral feet in floating boots.     Data Reviewed: I have personally reviewed following labs and imaging studies  CBC: Recent Labs  Lab 03/05/21 1607 03/07/21 0059 03/08/21 0050 03/09/21 0145 03/10/21 0125  WBC 19.8* 16.0* 14.2* 14.4* 13.6*  NEUTROABS 18.2*  --   --   --   --   HGB 14.8 11.7* 12.0 9.4* 9.5*  HCT 47.8* 38.2 37.8 30.2* 31.0*  MCV 87.1 86.6 84.8 86.0 86.1  PLT 445* 484* 336 383 456*    Basic Metabolic Panel: Recent Labs  Lab 03/05/21 1607 03/07/21 0059 03/08/21 0050 03/09/21 0145 03/10/21 0125  NA 139 141 133* 137 137  K 3.2* 3.3* 3.3* 3.6 3.5  CL 96* 101 97* 98 98  CO2 30 33* 28 32 32  GLUCOSE 183* 160* 144* 113* 90  BUN '23 19 21 ' 33* 26*  CREATININE 0.95 0.80 0.75 1.02* 0.77  CALCIUM 9.7 9.2 8.8* 8.6* 8.3*    GFR: Estimated Creatinine Clearance: 38.4 mL/min (by C-G formula based on SCr of 0.77 mg/dL).  Liver Function Tests: Recent Labs  Lab 03/05/21 1607  AST 43*  ALT 37  ALKPHOS 81  BILITOT 0.6   PROT 6.9  ALBUMIN 2.9*    CBG: No results for input(s): GLUCAP in the last 168 hours.   Recent Results (from the past 240 hour(s))  Urine Culture     Status: Abnormal   Collection Time: 03/06/21  8:56 AM   Specimen: Urine, Clean Catch  Result Value Ref Range Status   Specimen Description URINE, CLEAN CATCH  Final   Special Requests   Final    NONE Performed at [facility], [address], [place], [place] [address]    Culture MULTIPLE SPECIES PRESENT, SUGGEST RECOLLECTION (A)  Final   Report Status 03/08/2021 FINAL  Final  Resp Panel by RT-PCR (Flu A&B, Covid) Nasopharyngeal Swab     Status: None   Collection Time: 03/06/21  9:19 AM   Specimen: Nasopharyngeal Swab; Nasopharyngeal(NP) swabs in vial transport medium  Result Value Ref Range Status   SARS Coronavirus 2 by RT PCR NEGATIVE NEGATIVE Final    Comment: (NOTE) SARS-CoV-2 target nucleic acids are NOT DETECTED.  The SARS-CoV-2 RNA is generally detectable in upper respiratory specimens during the acute phase of infection. The lowest concentration of SARS-CoV-2 viral copies this assay can detect is 138 copies/mL. A negative result does not preclude SARS-Cov-2 infection and should not be used as the sole basis for treatment or other patient management decisions. A negative result may occur with  improper specimen collection/handling, submission of specimen other than nasopharyngeal swab, presence of viral mutation(s) within the areas targeted by this assay, and inadequate number of viral copies(<138 copies/mL). A negative result must be combined with clinical observations, patient history, and epidemiological information. The expected result is Negative.  Fact Sheet for Patients:  EntrepreneurPulse.com.au  Fact Sheet for Healthcare Providers:  IncredibleEmployment.be  This test is no t yet approved or cleared by the [place] FDA and  has been authorized for detection  and/or diagnosis of SARS-CoV-2 by FDA under an Emergency Use Authorization (EUA). This EUA will remain  in effect (meaning this test can be used) for the duration of the COVID-19 declaration under Section 564(b)(1) of the Act, 21 U.S.C.section 360bbb-3(b)(1), unless the authorization is terminated  or revoked sooner.       Influenza A by PCR NEGATIVE NEGATIVE Final   Influenza B by PCR NEGATIVE NEGATIVE Final    Comment: (NOTE) The Xpert Xpress SARS-CoV-2/FLU/RSV plus assay is intended as an aid in the diagnosis of influenza from Nasopharyngeal swab specimens and should not be used as a sole basis for treatment. Nasal washings and aspirates are unacceptable for Xpert Xpress SARS-CoV-2/FLU/RSV testing.  Fact Sheet for Patients: EntrepreneurPulse.com.au  Fact Sheet for Healthcare Providers: IncredibleEmployment.be  This test is not yet approved or cleared by the [place] FDA and has been authorized for detection and/or diagnosis of SARS-CoV-2 by FDA under an Emergency Use Authorization (EUA). This EUA will remain in effect (meaning this test can be used) for the duration of the COVID-19 declaration under Section 564(b)(1) of the Act, 21 U.S.C. section 360bbb-3(b)(1), unless the authorization is terminated or revoked.  Performed at [facility], [place] [address], [place], [place] [address]   Blood culture (routine x 2)     Status: None   Collection Time: 03/06/21  9:25 AM   Specimen: BLOOD  Result Value Ref Range Status   Specimen Description BLOOD SITE NOT SPECIFIED  Final   Special Requests   Final    BOTTLES DRAWN AEROBIC AND ANAEROBIC Blood Culture adequate volume   Culture   Final    NO GROWTH 5 DAYS Performed at [facility], [address], [place], [place] [address]    Report Status 03/11/2021 FINAL  Final  Blood culture (routine x 2)     Status: None   Collection Time: 03/06/21  9:27 AM   Specimen: BLOOD  Result  Value Ref Range Status   Specimen Description BLOOD SITE NOT SPECIFIED  Final   Special Requests   Final    BOTTLES DRAWN AEROBIC AND ANAEROBIC Blood Culture results may not be optimal due to an inadequate volume of blood received in culture bottles   Culture   Final  NO GROWTH 5 DAYS Performed at [facility], [place] [address] [place], [place] [address]    Report Status 03/11/2021 FINAL  Final  MRSA Next Gen by PCR, Nasal     Status: None   Collection Time: 03/08/21 10:40 AM   Specimen: Nasal Mucosa; Nasal Swab  Result Value Ref Range Status   MRSA by PCR Next Gen NOT DETECTED NOT DETECTED Final    Comment: (NOTE) The GeneXpert MRSA Assay (FDA approved for NASAL specimens only), is one component of a comprehensive MRSA colonization surveillance program. It is not intended to diagnose MRSA infection nor to guide or monitor treatment for MRSA infections. Test performance is not FDA approved in patients less than 47 years old. Performed at [facility], [place] [address], [place],  [address]          Radiology Studies: No results found.      Scheduled Meds:  docusate sodium  100 mg Oral BID   enoxaparin (LOVENOX) injection  40 mg Subcutaneous Q24H   feeding supplement  237 mL Oral TID BM   liver oil-zinc oxide   Topical BID   LORazepam  1 mg Intravenous Once   multivitamin with minerals  1 tablet Oral Daily   sodium chloride flush  3 mL Intravenous Q12H   Continuous Infusions:  cefTRIAXone (ROCEPHIN)  IV 2 g (03/10/21 1825)   metronidazole 500 mg (03/11/21 0821)     LOS: 5 days       Phillips Climes, MD [facility]   To contact the attending provider between 7A-7P or the covering provider during after hours 7P-7A, please log into the web site [email or web address] and access using universal [place] password for that web site. If you do not have the password, please call the hospital operator.  03/11/2021, 3:19 PM

## 2021-03-11 NOTE — TOC Progression Note (Signed)
Transition of Care [facility]) - Progression Note    Patient Details  Name: [name] MRN: [id number] Date of Birth: 27-Feb-1926  Transition of Care [facility]) CM/SW Contact  Mearl Latin, LCSW Phone Number: 03/11/2021, 12:18 PM  Clinical Narrative:    CSW received insurance authorization for patient to discharge to [facility] tomorrow: [phone] ID# [id number], effective 03/10/21-03/13/21.     Expected Discharge Plan: Skilled Nursing Facility Barriers to Discharge: Continued Medical Work up, English as a second language teacher, SNF Pending bed offer  Expected Discharge Plan and Services Expected Discharge Plan: Skilled Nursing Facility In-house Referral: Clinical Social Work   Post Acute Care Choice: Skilled Nursing Facility Living arrangements for the past 2 months: Single Family Home                                       Social Determinants of Health (SDOH) Interventions    Readmission Risk Interventions No flowsheet data found.

## 2021-03-12 DIAGNOSIS — L03115 Cellulitis of right lower limb: Secondary | ICD-10-CM | POA: Diagnosis not present

## 2021-03-12 DIAGNOSIS — A419 Sepsis, unspecified organism: Secondary | ICD-10-CM | POA: Diagnosis not present

## 2021-03-12 DIAGNOSIS — E43 Unspecified severe protein-calorie malnutrition: Secondary | ICD-10-CM | POA: Diagnosis not present

## 2021-03-12 MED ORDER — ALPRAZOLAM 0.25 MG PO TABS: 0.2500 mg | ORAL_TABLET | Freq: Two times a day (BID) | ORAL | 0 refills | Status: AC | PRN

## 2021-03-12 MED ORDER — POLYETHYLENE GLYCOL 3350 17 G PO PACK: 17.0000 g | PACK | Freq: Every day | ORAL | 0 refills | Status: AC | PRN

## 2021-03-12 MED ORDER — ENSURE ENLIVE PO LIQD: 237.0000 mL | Freq: Three times a day (TID) | ORAL | 12 refills | Status: AC

## 2021-03-12 MED ORDER — CEPHALEXIN 250 MG PO CAPS: 250.0000 mg | ORAL_CAPSULE | Freq: Four times a day (QID) | ORAL | 0 refills | Status: AC

## 2021-03-12 MED ORDER — DOCUSATE SODIUM 100 MG PO CAPS: 100.0000 mg | ORAL_CAPSULE | Freq: Two times a day (BID) | ORAL | 0 refills | Status: AC

## 2021-03-12 MED ORDER — COVID-19 MRNA VAC-TRIS(PFIZER) 30 MCG/0.3ML IM SUSP
0.3000 mL | Freq: Once | INTRAMUSCULAR | Status: AC
  Administered 2021-03-12: 0.3 mL via INTRAMUSCULAR
  Filled 2021-03-12 (×2): qty 0.3

## 2021-03-12 MED ORDER — ACETAMINOPHEN 325 MG PO TABS: 650.0000 mg | ORAL_TABLET | Freq: Four times a day (QID) | ORAL | Status: AC | PRN

## 2021-03-12 NOTE — TOC Transition Note (Signed)
Transition of Care [facility]) - CM/SW Discharge Note   Patient Details  Name: [name] MRN: [id number] Date of Birth: 1926/05/28  Transition of Care [facility]) CM/SW Contact:  Mearl Latin, LCSW Phone Number: 03/12/2021, 2:33 PM   Clinical Narrative:    Patient will DC to: [facility] Anticipated DC date: 03/12/21 Family notified: Daughter, Fish farm manager by: Sharin Mons   Per MD patient ready for DC to [facility]. RN to call report prior to discharge [phone] room 104). RN, patient, patient's family, and facility notified of DC. Discharge Summary and FL2 sent to facility. DC packet on chart. Ambulance transport requested for patient.   CSW will sign off for now as social work intervention is no longer needed. Please consult [place] again if new needs arise.     Final next level of care: Skilled Nursing Facility Barriers to Discharge: Barriers Resolved   Patient Goals and CMS Choice Patient states their goals for this hospitalization and ongoing recovery are:: Rehab CMS Medicare.gov Compare Post Acute Care list provided to:: Patient Represenative (must comment) Choice offered to / list presented to : Adult Children  Discharge Placement   Existing PASRR number confirmed : 03/12/21          Patient [facility] bed at: [facility] Patient to be transferred to facility by: [facility] Name of family member notified: Daughter Patient and family notified of of transfer: 03/12/21  Discharge Plan and Services In-house Referral: Clinical Social Work   Post Acute Care Choice: Skilled Nursing Facility                               Social Determinants of Health (SDOH) Interventions     Readmission Risk Interventions No flowsheet data found.

## 2021-03-12 NOTE — TOC Progression Note (Signed)
Transition of Care [facility]) - Progression Note    Patient Details  Name: [name] MRN: [id number] Date of Birth: 1926-03-27  Transition of Care [facility]) CM/SW Contact  Mearl Latin, LCSW Phone Number: 03/12/2021, 11:29 AM  Clinical Narrative:    [facility] requesting a COVID booster shot. CSW spoke with patient's daughter and she is in agreement for vaccine. CSW will contact [facility] for transport this afternoon.    Expected Discharge Plan: Skilled Nursing Facility Barriers to Discharge: Continued Medical Work up, English as a second language teacher, SNF Pending bed offer  Expected Discharge Plan and Services Expected Discharge Plan: Skilled Nursing Facility In-house Referral: Clinical Social Work   Post Acute Care Choice: Skilled Nursing Facility Living arrangements for the past 2 months: Single Family Home Expected Discharge Date: 03/12/21                                     Social Determinants of Health (SDOH) Interventions    Readmission Risk Interventions No flowsheet data found.

## 2021-03-12 NOTE — Progress Notes (Signed)
23:30- Ambulance transportation arrived to take patient to [facility]. Report called to receiving RN Paulina.

## 2021-03-12 NOTE — Discharge Summary (Signed)
Physician Discharge Summary  Zannie CoveDorothy Martinez ZOX:[id number] DOB: 08-30-1925 DOA: 03/05/2021  PCP: Merlene LaughterStoneking, Hal, MD  Admit date: 03/05/2021 Discharge date: 03/12/2021  Admitted From:Home Disposition:  SNF [facility]   Recommendations for Outpatient Follow-up:  Follow up with PCP in 1 week. Continue with B/L heel wound. Blood pressure has been acceptable at time of discharge, please start on antihypertensive regimen if needed.   Discharge Condition:Stable CODE STATUS:DNR Diet recommendation: Regular   Brief/Interim Summary:    Discharge Diagnoses:  Principal Problem:   Sepsis due to undetermined organism [facility](HCC) Active Problems:   Dementia (HCC)   Hypertension   Lower extremity ulceration, right, with fat layer exposed (HCC)   Stage 3a chronic kidney disease (HCC)   Hyperglycemia   Malnutrition (HCC)   DNR (do not resuscitate)   Protein-calorie malnutrition, severe     Sepsis due to infected right heel ulcer, with surrounding cellulitis with myofasciitis -Sepsis criteria present on admission, leukocytosis, tachycardia, tachypnea with elevated lactic acid. -Started empirically on IV Rocephin and vancomycin, her MRSA PCR screen is negative, vancomycin has been discontinued, he was kept on Rocephin, and she is transition to Keflex at time of discharge to continue another 5 days,, she will need to continue local wound care. -Blood culture remains with no growth to date. -MRI significant for cellulitis with myofasciitis, no evidence of osteomyelitis. -Wound care input greatly appreciated, continue with local wound care   Foot Wound -Patient was bilateral heel pressure ulcer, the right got infected. -We will consult wound care   HTN -Patient was continued on Norvasc, hydralazine, HCTZ, Diovan, but her blood pressure was actually noted to be on the lower side, so all her meds has been stopped, blood pressure remains acceptable at time of discharge without any medications,  so it will be held on discharge.  Marland Kitchen.    Hyponatremia -Mild, due to mild SIADH, asymptomatic, stable   Hypokalemia -Repleted   Stage 3a CKD -Appears to be at/near baseline at this time   Hyperglycemia -A1c is 5.5   Severe protein calorie malnutrition -Body mass index is 18.51 kg/m.. -The patient has at least 2 indicators for malnutrition (insufficient energy intake, weight loss, loss of muscle mass, loss of subcutaneous fat, edema, diminished functional status -Duration consulted   Dementia/Goals of care -Family reports some difficulty with ADLs at baseline -Has not been able to walk for the last 3 weeks due to leg weakness -Appears likely to benefit from SNF rehab -GOC discussed and patient prefers aggressive treatment approach -I have discussed code status with the patient and her daughter and  they are in agreement that the patient would not desire resuscitation and would prefer to die a natural death should that situation arise; she will need a gold out of facility DNR form at the time of discharge       Discharge Instructions  Discharge Instructions     Diet - low sodium heart healthy   Complete by: As directed    Discharge wound care:   Complete by: As directed    Wound care to posterior right heel:  Cleanse with soap and water, rinse with NS and pat dry. Cover with xeroform gauze [place] # [id number]), cover both heels with Mepilex heel foam dressing and place both heels in Prevalon boot. Change Xeroform daily.  Cleanse the sacral area with no rinse cleanser pat dry and apply Desitin cream to the entire area twice daily or PRN soiling.   Increase activity slowly   Complete by: As  directed       Allergies as of 03/12/2021   No Known Allergies      Medication List     STOP taking these medications    amLODipine 10 MG tablet Commonly known as: NORVASC   hydrALAZINE 25 MG tablet Commonly known as: APRESOLINE   hydrochlorothiazide 25 MG tablet Commonly known as:  HYDRODIURIL   naproxen 375 MG tablet Commonly known as: NAPROSYN   valsartan 320 MG tablet Commonly known as: DIOVAN   valsartan-hydrochlorothiazide 320-25 MG tablet Commonly known as: DIOVAN-HCT       TAKE these medications    acetaminophen 325 MG tablet Commonly known as: TYLENOL Take 2 tablets (650 mg total) by mouth every 6 (six) hours as needed for mild pain (or Fever >/= 101).   ALPRAZolam 0.25 MG tablet Commonly known as: XANAX Take 1 tablet (0.25 mg total) by mouth 2 (two) times daily as needed.   cephALEXin 250 MG capsule Commonly known as: KEFLEX Take 1 capsule (250 mg total) by mouth 4 (four) times daily for 5 days.   docusate sodium 100 MG capsule Commonly known as: COLACE Take 1 capsule (100 mg total) by mouth 2 (two) times daily.   feeding supplement Liqd Take 237 mLs by mouth 3 (three) times daily between meals.   polyethylene glycol 17 g packet Commonly known as: MIRALAX / GLYCOLAX Take 17 g by mouth daily as needed for mild constipation.               Discharge Care Instructions  (From admission, onward)           Start     Ordered   03/12/21 0000  Discharge wound care:       Comments: Wound care to posterior right heel:  Cleanse with soap and water, rinse with NS and pat dry. Cover with xeroform gauze [place] # [id number]), cover both heels with Mepilex heel foam dressing and place both heels in Prevalon boot. Change Xeroform daily.  Cleanse the sacral area with no rinse cleanser pat dry and apply Desitin cream to the entire area twice daily or PRN soiling.   03/12/21 1123            No Known Allergies  Consultations: none   Procedures/Studies: MR HEEL RIGHT W WO CONTRAST  Result Date: 03/07/2021 CLINICAL DATA:  Foot pain and swelling. EXAM: MR OF THE RIGHT HEEL WITHOUT AND WITH CONTRAST TECHNIQUE: Multiplanar, multisequence MR imaging of the right foot was performed both before and after administration of intravenous contrast.  CONTRAST:  30mL GADAVIST GADOBUTROL 1 MMOL/ML IV SOLN COMPARISON:  Radiographs, same date. FINDINGS: Diffuse subcutaneous soft tissue swelling/edema consistent with cellulitis. There is also diffuse myofasciitis. No discrete rim enhancing soft tissue abscess is identified. No findings to suggest pyomyositis. No MR findings suspicious for septic arthritis or osteomyelitis. The major tendons and ligaments are intact. IMPRESSION: Diffuse cellulitis and myofasciitis but no discrete soft tissue abscess, pyomyositis, septic arthritis or osteomyelitis. Electronically Signed   By: Rudie Meyer M.D.   On: 03/07/2021 05:05   DG Foot Complete Right  Result Date: 03/05/2021 CLINICAL DATA:  Foot infection EXAM: RIGHT FOOT COMPLETE - 3+ VIEW COMPARISON:  None. FINDINGS: There is no evidence of acute fracture. Hallux valgus with mild first MTP degenerative change. There is no visible bone destruction/osseous erosion. There is diffuse soft tissue swelling of the ankle and foot. Vascular calcifications. IMPRESSION: Diffuse soft tissue swelling of the ankle and foot. No acute osseous abnormality identified.  Electronically Signed   By: Caprice Renshaw   On: 03/05/2021 18:31   VAS [place] ABI WITH/WO TBI  Result Date: 03/08/2021  LOWER EXTREMITY DOPPLER STUDY Patient Name:  [facility]  Date of Exam:   03/07/2021 Medical Rec #: [id number]      Accession #:    [id number] Date of Birth: 07-03-1926      Patient Gender: F Patient Age:   83Y Exam Location:  [facility] Procedure:      VAS [place] ABI WITH/WO TBI Referring Phys: [id number] JENNIFER YATES --------------------------------------------------------------------------------  Indications: Ulceration.  Comparison Study: No previous exam Performing Technologist: Hill, Jody RVT, RDMS  Examination Guidelines: A complete evaluation includes at minimum, Doppler waveform signals and systolic blood pressure reading at the level of bilateral brachial, anterior tibial, and posterior tibial  arteries, when vessel segments are accessible. Bilateral testing is considered an integral part of a complete examination. Photoelectric Plethysmograph (PPG) waveforms and toe systolic pressure readings are included as required and additional duplex testing as needed. Limited examinations for reoccurring indications may be performed as noted.  ABI Findings: +---------+------------------+-----+----------+--------+ Right    Rt Pressure (mmHg)IndexWaveform  Comment  +---------+------------------+-----+----------+--------+ Brachial 139                    triphasic          +---------+------------------+-----+----------+--------+ PTA      161               1.16 monophasic         +---------+------------------+-----+----------+--------+ DP       191               1.37 monophasic         +---------+------------------+-----+----------+--------+ Great Toe63                0.45 Abnormal           +---------+------------------+-----+----------+--------+ +---------+------------------+-----+----------+-------+ Left     Lt Pressure (mmHg)IndexWaveform  Comment +---------+------------------+-----+----------+-------+ Brachial 138                    triphasic         +---------+------------------+-----+----------+-------+ PTA      119               0.86 monophasic        +---------+------------------+-----+----------+-------+ DP                              monophasic>254    +---------+------------------+-----+----------+-------+ Great Toe65                0.47 Abnormal          +---------+------------------+-----+----------+-------+ +-------+-----------+-----------+------------+------------+ ABI/TBIToday's ABIToday's TBIPrevious ABIPrevious TBI +-------+-----------+-----------+------------+------------+ Right  1.37       0.45                                +-------+-----------+-----------+------------+------------+ Left   [place]         0.47                                 +-------+-----------+-----------+------------+------------+ Arterial wall calcification precludes accurate ankle pressures and ABIs.  Summary: Right: Resting right ankle-brachial index indicates noncompressible right lower extremity arteries. The right toe-brachial index is abnormal and indicates moderate resting ischemia. ABIs are unreliable. RT great toe  pressure = 63 mmHg. Left: Resting left ankle-brachial index indicates noncompressible left lower extremity arteries. The left toe-brachial index is abnormal and indicates moderate resting ischemia. ABIs are unreliable. LT Great toe pressure = 65 mmHg.  *See table(s) above for measurements and observations.  Electronically signed by Lemar Livings MD on 03/08/2021 at 2:49:55 PM.    Final       Subjective:  Herself denies any complaints, no significant events overnight as discussed with staff. Discharge Exam: Vitals:   03/12/21 0822 03/12/21 1217  BP: (!) 127/55 (!) 144/53  Pulse: 95 95  Resp: 18 15  Temp: 97.7 F (36.5 C) 97.6 F (36.4 C)  SpO2: 100% 100%   Vitals:   03/12/21 0331 03/12/21 0542 03/12/21 0822 03/12/21 1217  BP: (!) 129/55  (!) 127/55 (!) 144/53  Pulse: 89  95 95  Resp: 13  18 15   Temp: 98.5 F (36.9 C)  97.7 F (36.5 C) 97.6 F (36.4 C)  TempSrc:   Oral Oral  SpO2: 100%  100% 100%  Weight:  55.6 kg    Height:        Awake Alert, pleasant, impaired judgment and insight symmetrical Chest wall movement, Good air movement bilaterally, CTAB RRR,No Gallops,Rubs or new Murmurs, No Parasternal Heave +ve B.Sounds, Abd Soft, No tenderness, No rebound - guarding or rigidity. No Cyanosis, Clubbing or edema, bilateral feet in floating boots.     The results of significant diagnostics from this hospitalization (including imaging, microbiology, ancillary and laboratory) are listed below for reference.     Microbiology: Recent Results (from the past 240 hour(s))  Urine Culture     Status: Abnormal    Collection Time: 03/06/21  8:56 AM   Specimen: Urine, Clean Catch  Result Value Ref Range Status   Specimen Description URINE, CLEAN CATCH  Final   Special Requests   Final    NONE Performed at [facility], [address] [place], [place]    Culture MULTIPLE SPECIES PRESENT, SUGGEST RECOLLECTION (A)  Final   Report Status 03/08/2021 FINAL  Final  Resp Panel by RT-PCR (Flu A&B, Covid) Nasopharyngeal Swab     Status: None   Collection Time: 03/06/21  9:19 AM   Specimen: Nasopharyngeal Swab; Nasopharyngeal(NP) swabs in vial transport medium  Result Value Ref Range Status   SARS Coronavirus 2 by RT PCR NEGATIVE NEGATIVE Final    Comment: (NOTE) SARS-CoV-2 target nucleic acids are NOT DETECTED.  The SARS-CoV-2 RNA is generally detectable in upper respiratory specimens during the acute phase of infection. The lowest concentration of SARS-CoV-2 viral copies this assay can detect is 138 copies/mL. A negative result does not preclude SARS-Cov-2 infection and should not be used as the sole basis for treatment or other patient management decisions. A negative result may occur with  improper specimen collection/handling, submission of specimen other than nasopharyngeal swab, presence of viral mutation(s) within the areas targeted by this assay, and inadequate number of viral copies(<138 copies/mL). A negative result must be combined with clinical observations, patient history, and epidemiological information. The expected result is Negative.  Fact Sheet for Patients:  03/08/21  Fact Sheet for Healthcare Providers:  BloggerCourse.com  This test is no t yet approved or cleared by the [place] FDA and  has been authorized for detection and/or diagnosis of SARS-CoV-2 by FDA under an Emergency Use Authorization (EUA). This EUA will remain  in effect (meaning this test can be used) for the duration of the COVID-19  declaration under Section 564(b)(1) of the Act, 21 U.S.C.section 360bbb-3(b)(1), unless the authorization is terminated  or revoked sooner.       Influenza A by PCR NEGATIVE NEGATIVE Final   Influenza B by PCR NEGATIVE NEGATIVE Final    Comment: (NOTE) The Xpert Xpress SARS-CoV-2/FLU/RSV plus assay is intended as an aid in the diagnosis of influenza from Nasopharyngeal swab specimens and should not be used as a sole basis for treatment. Nasal washings and aspirates are unacceptable for Xpert Xpress SARS-CoV-2/FLU/RSV testing.  Fact Sheet for Patients: BloggerCourse.com  Fact Sheet for Healthcare Providers: SeriousBroker.it  This test is not yet approved or cleared by the [place] FDA and has been authorized for detection and/or diagnosis of SARS-CoV-2 by FDA under an Emergency Use Authorization (EUA). This EUA will remain in effect (meaning this test can be used) for the duration of the COVID-19 declaration under Section 564(b)(1) of the Act, 21 U.S.C. section 360bbb-3(b)(1), unless the authorization is terminated or revoked.  Performed at [facility], [address] [place], [place] [address]   Blood culture (routine x 2)     Status: None   Collection Time: 03/06/21  9:25 AM   Specimen: BLOOD  Result Value Ref Range Status   Specimen Description BLOOD SITE NOT SPECIFIED  Final   Special Requests   Final    BOTTLES DRAWN AEROBIC AND ANAEROBIC Blood Culture adequate volume   Culture   Final    NO GROWTH 5 DAYS Performed at [facility], [address] [place], [place] [address]    Report Status 03/11/2021 FINAL  Final  Blood culture (routine x 2)     Status: None   Collection Time: 03/06/21  9:27 AM   Specimen: BLOOD  Result Value Ref Range Status   Specimen Description BLOOD SITE NOT SPECIFIED  Final   Special Requests   Final    BOTTLES DRAWN AEROBIC AND ANAEROBIC Blood Culture results may not be  optimal due to an inadequate volume of blood received in culture bottles   Culture   Final    NO GROWTH 5 DAYS Performed at [facility], [address], [place], [place] [address]    Report Status 03/11/2021 FINAL  Final  MRSA Next Gen by PCR, Nasal     Status: None   Collection Time: 03/08/21 10:40 AM   Specimen: Nasal Mucosa; Nasal Swab  Result Value Ref Range Status   MRSA by PCR Next Gen NOT DETECTED NOT DETECTED Final    Comment: (NOTE) The GeneXpert MRSA Assay (FDA approved for NASAL specimens only), is one component of a comprehensive MRSA colonization surveillance program. It is not intended to diagnose MRSA infection nor to guide or monitor treatment for MRSA infections. Test performance is not FDA approved in patients less than 75 years old. Performed at [facility], [address] [place], [place] [address]   SARS CORONAVIRUS 2 (TAT 6-24 HRS) Nasopharyngeal Nasopharyngeal Swab     Status: None   Collection Time: 03/11/21 12:07 PM   Specimen: Nasopharyngeal Swab  Result Value Ref Range Status   SARS Coronavirus 2 NEGATIVE NEGATIVE Final    Comment: (NOTE) SARS-CoV-2 target nucleic acids are NOT DETECTED.  The SARS-CoV-2 RNA is generally detectable in upper and lower respiratory specimens during the acute phase of infection. Negative results do not preclude SARS-CoV-2 infection, do not rule out co-infections with other pathogens, and should not be used as the sole basis for treatment  or other patient management decisions. Negative results must be combined with clinical observations, patient history, and epidemiological information. The expected result is Negative.  Fact Sheet for Patients: HairSlick.no  Fact Sheet for Healthcare Providers: quierodirigir.com  This test is not yet approved or cleared by the [place] FDA and  has been authorized for detection and/or diagnosis of SARS-CoV-2  by FDA under an Emergency Use Authorization (EUA). This EUA will remain  in effect (meaning this test can be used) for the duration of the COVID-19 declaration under Se ction 564(b)(1) of the Act, 21 U.S.C. section 360bbb-3(b)(1), unless the authorization is terminated or revoked sooner.  Performed at [facility], [address] [place], [place] [address]      Labs: BNP (last 3 results) No results for input(s): BNP in the last 8760 hours. Basic Metabolic Panel: Recent Labs  Lab 03/05/21 1607 03/07/21 0059 03/08/21 0050 03/09/21 0145 03/10/21 0125  NA 139 141 133* 137 137  K 3.2* 3.3* 3.3* 3.6 3.5  CL 96* 101 97* 98 98  CO2 30 33* 28 32 32  GLUCOSE 183* 160* 144* 113* 90  BUN 33* 26*  CREATININE 0.95 0.80 0.75 1.02* 0.77  CALCIUM 9.7 9.2 8.8* 8.6* 8.3*   Liver Function Tests: Recent Labs  Lab 03/05/21 1607  AST 43*  ALT 37  ALKPHOS 81  BILITOT 0.6  PROT 6.9  ALBUMIN 2.9*   No results for input(s): LIPASE, AMYLASE in the last 168 hours. No results for input(s): AMMONIA in the last 168 hours. CBC: Recent Labs  Lab 03/05/21 1607 03/07/21 0059 03/08/21 0050 03/09/21 0145 03/10/21 0125  WBC 19.8* 16.0* 14.2* 14.4* 13.6*  NEUTROABS 18.2*  --   --   --   --   HGB 14.8 11.7* 12.0 9.4* 9.5*  HCT 47.8* 38.2 37.8 30.2* 31.0*  MCV 87.1 86.6 84.8 86.0 86.1  PLT 445* 484* 336 383 456*   Cardiac Enzymes: No results for input(s): CKTOTAL, CKMB, CKMBINDEX, TROPONINI in the last 168 hours. BNP: Invalid input(s): POCBNP CBG: No results for input(s): GLUCAP in the last 168 hours. D-Dimer No results for input(s): DDIMER in the last 72 hours. Hgb A1c No results for input(s): HGBA1C in the last 72 hours. Lipid Profile No results for input(s): CHOL, HDL, LDLCALC, TRIG, CHOLHDL, LDLDIRECT in the last 72 hours. Thyroid function studies No results for input(s): TSH, T4TOTAL, T3FREE, THYROIDAB in the last 72 hours.  Invalid input(s): FREET3 Anemia work  up No results for input(s): VITAMINB12, FOLATE, FERRITIN, TIBC, IRON, RETICCTPCT in the last 72 hours. Urinalysis    Component Value Date/Time   COLORURINE YELLOW 03/06/2021 1626   APPEARANCEUR HAZY (A) 03/06/2021 1626   LABSPEC 1.014 03/06/2021 1626   PHURINE 6.0 03/06/2021 1626   GLUCOSEU NEGATIVE 03/06/2021 1626   HGBUR NEGATIVE 03/06/2021 1626   BILIRUBINUR NEGATIVE 03/06/2021 1626   KETONESUR NEGATIVE 03/06/2021 1626   PROTEINUR NEGATIVE 03/06/2021 1626   UROBILINOGEN 0.2 03/08/2009 1136   NITRITE NEGATIVE 03/06/2021 1626   LEUKOCYTESUR TRACE (A) 03/06/2021 1626   Sepsis Labs Invalid input(s): PROCALCITONIN,  WBC,  LACTICIDVEN Microbiology Recent Results (from the past 240 hour(s))  Urine Culture     Status: Abnormal   Collection Time: 03/06/21  8:56 AM   Specimen: Urine, Clean Catch  Result Value Ref Range Status   Specimen Description URINE, CLEAN CATCH  Final   Special Requests   Final    NONE Performed at [facility], [address] [place], [place] [address]    Culture MULTIPLE SPECIES PRESENT, SUGGEST RECOLLECTION (A)  Final   Report Status 03/08/2021 FINAL  Final  Resp Panel by RT-PCR (Flu A&B, Covid) Nasopharyngeal Swab     Status: None   Collection Time: 03/06/21  9:19 AM   Specimen: Nasopharyngeal Swab; Nasopharyngeal(NP) swabs in vial transport medium  Result Value Ref Range Status   SARS Coronavirus 2 by RT PCR NEGATIVE NEGATIVE Final    Comment: (NOTE) SARS-CoV-2 target nucleic acids are NOT DETECTED.  The SARS-CoV-2 RNA is generally detectable in upper respiratory specimens during the acute phase of infection. The lowest concentration of SARS-CoV-2 viral copies this assay can detect is 138 copies/mL. A negative result does not preclude SARS-Cov-2 infection and should not be used as the sole basis for treatment or other patient management decisions. A negative result may occur with  improper specimen collection/handling, submission of specimen  other than nasopharyngeal swab, presence of viral mutation(s) within the areas targeted by this assay, and inadequate number of viral copies(<138 copies/mL). A negative result must be combined with clinical observations, patient history, and epidemiological information. The expected result is Negative.  Fact Sheet for Patients:  BloggerCourse.com  Fact Sheet for Healthcare Providers:  SeriousBroker.it  This test is no t yet approved or cleared by the [place] FDA and  has been authorized for detection and/or diagnosis of SARS-CoV-2 by FDA under an Emergency Use Authorization (EUA). This EUA will remain  in effect (meaning this test can be used) for the duration of the COVID-19 declaration under Section 564(b)(1) of the Act, 21 U.S.C.section 360bbb-3(b)(1), unless the authorization is terminated  or revoked sooner.       Influenza A by PCR NEGATIVE NEGATIVE Final   Influenza B by PCR NEGATIVE NEGATIVE Final    Comment: (NOTE) The Xpert Xpress SARS-CoV-2/FLU/RSV plus assay is intended as an aid in the diagnosis of influenza from Nasopharyngeal swab specimens and should not be used as a sole basis for treatment. Nasal washings and aspirates are unacceptable for Xpert Xpress SARS-CoV-2/FLU/RSV testing.  Fact Sheet for Patients: BloggerCourse.com  Fact Sheet for Healthcare Providers: SeriousBroker.it  This test is not yet approved or cleared by the [place] FDA and has been authorized for detection and/or diagnosis of SARS-CoV-2 by FDA under an Emergency Use Authorization (EUA). This EUA will remain in effect (meaning this test can be used) for the duration of the COVID-19 declaration under Section 564(b)(1) of the Act, 21 U.S.C. section 360bbb-3(b)(1), unless the authorization is terminated or revoked.  Performed at [facility], [address] [place],  [place] [address]   Blood culture (routine x 2)     Status: None   Collection Time: 03/06/21  9:25 AM   Specimen: BLOOD  Result Value Ref Range Status   Specimen Description BLOOD SITE NOT SPECIFIED  Final   Special Requests   Final    BOTTLES DRAWN AEROBIC AND ANAEROBIC Blood Culture adequate volume   Culture   Final    NO GROWTH 5 DAYS Performed at [facility], [address] [place], [place] [address]    Report Status 03/11/2021 FINAL  Final  Blood culture (routine x 2)     Status: None   Collection Time: 03/06/21  9:27 AM   Specimen: BLOOD  Result Value Ref Range Status   Specimen Description BLOOD SITE NOT SPECIFIED  Final   Special Requests   Final    BOTTLES DRAWN AEROBIC  AND ANAEROBIC Blood Culture results may not be optimal due to an inadequate volume of blood received in culture bottles   Culture   Final    NO GROWTH 5 DAYS Performed at [facility], [address], [place], [place] [address]    Report Status 03/11/2021 FINAL  Final  MRSA Next Gen by PCR, Nasal     Status: None   Collection Time: 03/08/21 10:40 AM   Specimen: Nasal Mucosa; Nasal Swab  Result Value Ref Range Status   MRSA by PCR Next Gen NOT DETECTED NOT DETECTED Final    Comment: (NOTE) The GeneXpert MRSA Assay (FDA approved for NASAL specimens only), is one component of a comprehensive MRSA colonization surveillance program. It is not intended to diagnose MRSA infection nor to guide or monitor treatment for MRSA infections. Test performance is not FDA approved in patients less than 42 years old. Performed at [facility], [address] [place], [place] [address]   SARS CORONAVIRUS 2 (TAT 6-24 HRS) Nasopharyngeal Nasopharyngeal Swab     Status: None   Collection Time: 03/11/21 12:07 PM   Specimen: Nasopharyngeal Swab  Result Value Ref Range Status   SARS Coronavirus 2 NEGATIVE NEGATIVE Final    Comment: (NOTE) SARS-CoV-2 target nucleic acids are NOT DETECTED.  The SARS-CoV-2 RNA  is generally detectable in upper and lower respiratory specimens during the acute phase of infection. Negative results do not preclude SARS-CoV-2 infection, do not rule out co-infections with other pathogens, and should not be used as the sole basis for treatment or other patient management decisions. Negative results must be combined with clinical observations, patient history, and epidemiological information. The expected result is Negative.  Fact Sheet for Patients: HairSlick.no  Fact Sheet for Healthcare Providers: quierodirigir.com  This test is not yet approved or cleared by the [place] FDA and  has been authorized for detection and/or diagnosis of SARS-CoV-2 by FDA under an Emergency Use Authorization (EUA). This EUA will remain  in effect (meaning this test can be used) for the duration of the COVID-19 declaration under Se ction 564(b)(1) of the Act, 21 U.S.C. section 360bbb-3(b)(1), unless the authorization is terminated or revoked sooner.  Performed at [facility], [address] [place], [place] [address]      Time coordinating discharge: Over 30 minutes  SIGNED:   Huey Bienenstock, MD  [facility] 03/12/2021, 1:34 PM Pager   If 7PM-7AM, please contact night-coverage [email or web address] Password [email or web address]

## 2021-03-12 NOTE — Discharge Instructions (Signed)
Follow with Primary MD Merlene Laughter, MD in 7 days   Get CBC, CMP, checked  by Primary MD next visit.    Activity: As tolerated with Full fall precautions use walker/cane & assistance as needed   Disposition Home    Diet: Heart Healthy , with feeding assistance and aspiration precautions.    On your next visit with your primary care physician please Get Medicines reviewed and adjusted.   Please request your Prim.MD to go over all Hospital Tests and Procedure/Radiological results at the follow up, please get all Hospital records sent to your Prim MD by signing hospital release before you go home.   If you experience worsening of your admission symptoms, develop shortness of breath, life threatening emergency, suicidal or homicidal thoughts you must seek medical attention immediately by calling 911 or calling your MD immediately  if symptoms less severe.  You Must read complete instructions/literature along with all the possible adverse reactions/side effects for all the Medicines you take and that have been prescribed to you. Take any new Medicines after you have completely understood and accpet all the possible adverse reactions/side effects.   Do not drive, operating heavy machinery, perform activities at heights, swimming or participation in water activities or provide baby sitting services if your were admitted for syncope or siezures until you have seen by Primary MD or a Neurologist and advised to do so again.  Do not drive when taking Pain medications.    Do not take more than prescribed Pain, Sleep and Anxiety Medications  Special Instructions: If you have smoked or chewed Tobacco  in the last 2 yrs please stop smoking, stop any regular Alcohol  and or any Recreational drug use.  Wear Seat belts while driving.   Please note  You were cared for by a hospitalist during your hospital stay. If you have any questions about your discharge medications or the care you received  while you were in the hospital after you are discharged, you can call the unit and asked to speak with the hospitalist on call if the hospitalist that took care of you is not available. Once you are discharged, your primary care physician will handle any further medical issues. Please note that NO REFILLS for any discharge medications will be authorized once you are discharged, as it is imperative that you return to your primary care physician (or establish a relationship with a primary care physician if you do not have one) for your aftercare needs so that they can reassess your need for medications and monitor your lab values.

## 2021-03-12 NOTE — Progress Notes (Signed)
PT Cancellation Note  Patient Details Name: [name] MRN: [id number] DOB: 1925/11/30   Cancelled Treatment:    Reason Eval/Treat Not Completed: Other (comment) per chart and CSW, patient leaving for SNF today. Holding per dept protocol. Thank you for the opportunity to participate in her care!!   Lerry Liner PT, DPT, PN1   Supplemental Physical Therapist [facility]    Pager [phone] Acute Rehab Office [phone]

## 2021-03-13 DIAGNOSIS — F410 Panic disorder [episodic paroxysmal anxiety] without agoraphobia: Secondary | ICD-10-CM | POA: Diagnosis not present

## 2021-03-13 DIAGNOSIS — R262 Difficulty in walking, not elsewhere classified: Secondary | ICD-10-CM | POA: Diagnosis not present

## 2021-03-13 DIAGNOSIS — L89626 Pressure-induced deep tissue damage of left heel: Secondary | ICD-10-CM | POA: Diagnosis not present

## 2021-03-13 DIAGNOSIS — F0280 Dementia in other diseases classified elsewhere without behavioral disturbance: Secondary | ICD-10-CM | POA: Diagnosis not present

## 2021-03-13 DIAGNOSIS — A419 Sepsis, unspecified organism: Secondary | ICD-10-CM | POA: Diagnosis not present

## 2021-03-13 DIAGNOSIS — L89620 Pressure ulcer of left heel, unstageable: Secondary | ICD-10-CM | POA: Diagnosis not present

## 2021-03-13 DIAGNOSIS — G301 Alzheimer's disease with late onset: Secondary | ICD-10-CM | POA: Diagnosis not present

## 2021-03-13 DIAGNOSIS — L97412 Non-pressure chronic ulcer of right heel and midfoot with fat layer exposed: Secondary | ICD-10-CM | POA: Diagnosis not present

## 2021-03-13 DIAGNOSIS — R5381 Other malaise: Secondary | ICD-10-CM | POA: Diagnosis not present

## 2021-03-13 DIAGNOSIS — R600 Localized edema: Secondary | ICD-10-CM | POA: Diagnosis not present

## 2021-03-13 DIAGNOSIS — M6281 Muscle weakness (generalized): Secondary | ICD-10-CM | POA: Diagnosis not present

## 2021-03-13 DIAGNOSIS — L089 Local infection of the skin and subcutaneous tissue, unspecified: Secondary | ICD-10-CM | POA: Diagnosis not present

## 2021-03-13 DIAGNOSIS — L89613 Pressure ulcer of right heel, stage 3: Secondary | ICD-10-CM | POA: Diagnosis not present

## 2021-03-13 DIAGNOSIS — F0390 Unspecified dementia without behavioral disturbance: Secondary | ICD-10-CM | POA: Diagnosis not present

## 2021-03-13 DIAGNOSIS — L89616 Pressure-induced deep tissue damage of right heel: Secondary | ICD-10-CM | POA: Diagnosis not present

## 2021-03-13 DIAGNOSIS — L97912 Non-pressure chronic ulcer of unspecified part of right lower leg with fat layer exposed: Secondary | ICD-10-CM | POA: Diagnosis not present

## 2021-03-13 DIAGNOSIS — L89610 Pressure ulcer of right heel, unstageable: Secondary | ICD-10-CM | POA: Diagnosis not present

## 2021-03-13 DIAGNOSIS — I129 Hypertensive chronic kidney disease with stage 1 through stage 4 chronic kidney disease, or unspecified chronic kidney disease: Secondary | ICD-10-CM | POA: Diagnosis not present

## 2021-03-13 DIAGNOSIS — N1831 Chronic kidney disease, stage 3a: Secondary | ICD-10-CM | POA: Diagnosis not present

## 2021-03-13 DIAGNOSIS — I10 Essential (primary) hypertension: Secondary | ICD-10-CM | POA: Diagnosis not present

## 2021-03-13 DIAGNOSIS — R54 Age-related physical debility: Secondary | ICD-10-CM | POA: Diagnosis not present

## 2021-03-13 DIAGNOSIS — M25561 Pain in right knee: Secondary | ICD-10-CM | POA: Diagnosis not present

## 2021-03-13 DIAGNOSIS — E43 Unspecified severe protein-calorie malnutrition: Secondary | ICD-10-CM | POA: Diagnosis not present

## 2021-03-13 DIAGNOSIS — L89150 Pressure ulcer of sacral region, unstageable: Secondary | ICD-10-CM | POA: Diagnosis not present

## 2021-03-13 DIAGNOSIS — L89526 Pressure-induced deep tissue damage of left ankle: Secondary | ICD-10-CM | POA: Diagnosis not present

## 2021-03-13 DIAGNOSIS — L03115 Cellulitis of right lower limb: Secondary | ICD-10-CM | POA: Diagnosis not present

## 2021-03-13 DIAGNOSIS — S91301S Unspecified open wound, right foot, sequela: Secondary | ICD-10-CM | POA: Diagnosis not present

## 2021-03-14 DIAGNOSIS — L89616 Pressure-induced deep tissue damage of right heel: Secondary | ICD-10-CM | POA: Diagnosis not present

## 2021-03-14 DIAGNOSIS — L89150 Pressure ulcer of sacral region, unstageable: Secondary | ICD-10-CM | POA: Diagnosis not present

## 2021-03-14 DIAGNOSIS — L89626 Pressure-induced deep tissue damage of left heel: Secondary | ICD-10-CM | POA: Diagnosis not present

## 2021-03-14 DIAGNOSIS — L89613 Pressure ulcer of right heel, stage 3: Secondary | ICD-10-CM | POA: Diagnosis not present

## 2021-03-15 DIAGNOSIS — E43 Unspecified severe protein-calorie malnutrition: Secondary | ICD-10-CM | POA: Diagnosis not present

## 2021-03-15 DIAGNOSIS — G301 Alzheimer's disease with late onset: Secondary | ICD-10-CM | POA: Diagnosis not present

## 2021-03-15 DIAGNOSIS — L97412 Non-pressure chronic ulcer of right heel and midfoot with fat layer exposed: Secondary | ICD-10-CM | POA: Diagnosis not present

## 2021-03-15 DIAGNOSIS — F0280 Dementia in other diseases classified elsewhere without behavioral disturbance: Secondary | ICD-10-CM | POA: Diagnosis not present

## 2021-03-15 DIAGNOSIS — I129 Hypertensive chronic kidney disease with stage 1 through stage 4 chronic kidney disease, or unspecified chronic kidney disease: Secondary | ICD-10-CM | POA: Diagnosis not present

## 2021-03-15 DIAGNOSIS — I10 Essential (primary) hypertension: Secondary | ICD-10-CM | POA: Diagnosis not present

## 2021-03-15 DIAGNOSIS — N1831 Chronic kidney disease, stage 3a: Secondary | ICD-10-CM | POA: Diagnosis not present

## 2021-03-20 DIAGNOSIS — M25561 Pain in right knee: Secondary | ICD-10-CM | POA: Diagnosis not present

## 2021-03-20 DIAGNOSIS — R262 Difficulty in walking, not elsewhere classified: Secondary | ICD-10-CM | POA: Diagnosis not present

## 2021-03-20 DIAGNOSIS — R5381 Other malaise: Secondary | ICD-10-CM | POA: Diagnosis not present

## 2021-03-21 DIAGNOSIS — L89613 Pressure ulcer of right heel, stage 3: Secondary | ICD-10-CM | POA: Diagnosis not present

## 2021-03-21 DIAGNOSIS — L89150 Pressure ulcer of sacral region, unstageable: Secondary | ICD-10-CM | POA: Diagnosis not present

## 2021-03-21 DIAGNOSIS — L89610 Pressure ulcer of right heel, unstageable: Secondary | ICD-10-CM | POA: Diagnosis not present

## 2021-03-21 DIAGNOSIS — L89526 Pressure-induced deep tissue damage of left ankle: Secondary | ICD-10-CM | POA: Diagnosis not present

## 2021-03-27 DIAGNOSIS — M25561 Pain in right knee: Secondary | ICD-10-CM | POA: Diagnosis not present

## 2021-03-27 DIAGNOSIS — R262 Difficulty in walking, not elsewhere classified: Secondary | ICD-10-CM | POA: Diagnosis not present

## 2021-03-27 DIAGNOSIS — R5381 Other malaise: Secondary | ICD-10-CM | POA: Diagnosis not present

## 2021-03-28 DIAGNOSIS — L89620 Pressure ulcer of left heel, unstageable: Secondary | ICD-10-CM | POA: Diagnosis not present

## 2021-03-28 DIAGNOSIS — L89610 Pressure ulcer of right heel, unstageable: Secondary | ICD-10-CM | POA: Diagnosis not present

## 2021-03-28 DIAGNOSIS — L89150 Pressure ulcer of sacral region, unstageable: Secondary | ICD-10-CM | POA: Diagnosis not present

## 2021-03-28 DIAGNOSIS — L89613 Pressure ulcer of right heel, stage 3: Secondary | ICD-10-CM | POA: Diagnosis not present

## 2021-04-03 DIAGNOSIS — R5381 Other malaise: Secondary | ICD-10-CM | POA: Diagnosis not present

## 2021-04-03 DIAGNOSIS — F0390 Unspecified dementia without behavioral disturbance: Secondary | ICD-10-CM | POA: Diagnosis not present

## 2021-04-03 DIAGNOSIS — M25561 Pain in right knee: Secondary | ICD-10-CM | POA: Diagnosis not present

## 2021-04-03 DIAGNOSIS — R262 Difficulty in walking, not elsewhere classified: Secondary | ICD-10-CM | POA: Diagnosis not present

## 2021-04-03 DIAGNOSIS — F410 Panic disorder [episodic paroxysmal anxiety] without agoraphobia: Secondary | ICD-10-CM | POA: Diagnosis not present

## 2021-04-04 DIAGNOSIS — R54 Age-related physical debility: Secondary | ICD-10-CM | POA: Diagnosis not present

## 2021-04-04 DIAGNOSIS — L03115 Cellulitis of right lower limb: Secondary | ICD-10-CM | POA: Diagnosis not present

## 2021-04-04 DIAGNOSIS — F0390 Unspecified dementia without behavioral disturbance: Secondary | ICD-10-CM | POA: Diagnosis not present

## 2021-04-04 DIAGNOSIS — S91301S Unspecified open wound, right foot, sequela: Secondary | ICD-10-CM | POA: Diagnosis not present

## 2021-04-06 DIAGNOSIS — F0390 Unspecified dementia without behavioral disturbance: Secondary | ICD-10-CM | POA: Diagnosis not present

## 2021-04-06 DIAGNOSIS — L03115 Cellulitis of right lower limb: Secondary | ICD-10-CM | POA: Diagnosis not present

## 2021-04-06 DIAGNOSIS — S91301S Unspecified open wound, right foot, sequela: Secondary | ICD-10-CM | POA: Diagnosis not present

## 2021-04-06 DIAGNOSIS — R600 Localized edema: Secondary | ICD-10-CM | POA: Diagnosis not present

## 2021-04-06 DIAGNOSIS — R54 Age-related physical debility: Secondary | ICD-10-CM | POA: Diagnosis not present

## 2021-04-10 DIAGNOSIS — R262 Difficulty in walking, not elsewhere classified: Secondary | ICD-10-CM | POA: Diagnosis not present

## 2021-04-10 DIAGNOSIS — M25561 Pain in right knee: Secondary | ICD-10-CM | POA: Diagnosis not present

## 2021-04-10 DIAGNOSIS — R5381 Other malaise: Secondary | ICD-10-CM | POA: Diagnosis not present

## 2021-04-11 DIAGNOSIS — R54 Age-related physical debility: Secondary | ICD-10-CM | POA: Diagnosis not present

## 2021-04-11 DIAGNOSIS — L03115 Cellulitis of right lower limb: Secondary | ICD-10-CM | POA: Diagnosis not present

## 2021-04-11 DIAGNOSIS — L89626 Pressure-induced deep tissue damage of left heel: Secondary | ICD-10-CM | POA: Diagnosis not present

## 2021-04-11 DIAGNOSIS — E43 Unspecified severe protein-calorie malnutrition: Secondary | ICD-10-CM | POA: Diagnosis not present

## 2021-04-11 DIAGNOSIS — L89150 Pressure ulcer of sacral region, unstageable: Secondary | ICD-10-CM | POA: Diagnosis not present

## 2021-04-11 DIAGNOSIS — L89620 Pressure ulcer of left heel, unstageable: Secondary | ICD-10-CM | POA: Diagnosis not present

## 2021-04-11 DIAGNOSIS — R410 Disorientation, unspecified: Secondary | ICD-10-CM | POA: Diagnosis not present

## 2021-04-11 DIAGNOSIS — R4182 Altered mental status, unspecified: Secondary | ICD-10-CM | POA: Diagnosis not present

## 2021-04-11 DIAGNOSIS — L89610 Pressure ulcer of right heel, unstageable: Secondary | ICD-10-CM | POA: Diagnosis not present

## 2021-04-11 DIAGNOSIS — L89613 Pressure ulcer of right heel, stage 3: Secondary | ICD-10-CM | POA: Diagnosis not present

## 2021-04-11 DIAGNOSIS — M79672 Pain in left foot: Secondary | ICD-10-CM | POA: Diagnosis not present

## 2021-04-11 DIAGNOSIS — F0390 Unspecified dementia without behavioral disturbance: Secondary | ICD-10-CM | POA: Diagnosis not present

## 2021-04-11 DIAGNOSIS — Z139 Encounter for screening, unspecified: Secondary | ICD-10-CM | POA: Diagnosis not present

## 2021-04-11 DIAGNOSIS — M79671 Pain in right foot: Secondary | ICD-10-CM | POA: Diagnosis not present

## 2021-04-11 DIAGNOSIS — I10 Essential (primary) hypertension: Secondary | ICD-10-CM | POA: Diagnosis not present

## 2021-04-11 DIAGNOSIS — R739 Hyperglycemia, unspecified: Secondary | ICD-10-CM | POA: Diagnosis not present

## 2021-04-11 DIAGNOSIS — S91301S Unspecified open wound, right foot, sequela: Secondary | ICD-10-CM | POA: Diagnosis not present

## 2021-04-11 DIAGNOSIS — N1831 Chronic kidney disease, stage 3a: Secondary | ICD-10-CM | POA: Diagnosis not present

## 2021-04-12 DIAGNOSIS — F0390 Unspecified dementia without behavioral disturbance: Secondary | ICD-10-CM | POA: Diagnosis not present

## 2021-04-12 DIAGNOSIS — S91301S Unspecified open wound, right foot, sequela: Secondary | ICD-10-CM | POA: Diagnosis not present

## 2021-04-12 DIAGNOSIS — R627 Adult failure to thrive: Secondary | ICD-10-CM | POA: Diagnosis not present

## 2021-04-12 DIAGNOSIS — E43 Unspecified severe protein-calorie malnutrition: Secondary | ICD-10-CM | POA: Diagnosis not present

## 2021-04-12 DIAGNOSIS — L03115 Cellulitis of right lower limb: Secondary | ICD-10-CM | POA: Diagnosis not present

## 2021-04-12 DIAGNOSIS — L89626 Pressure-induced deep tissue damage of left heel: Secondary | ICD-10-CM | POA: Diagnosis not present

## 2021-04-17 DIAGNOSIS — I10 Essential (primary) hypertension: Secondary | ICD-10-CM | POA: Diagnosis not present

## 2021-04-18 DIAGNOSIS — L89153 Pressure ulcer of sacral region, stage 3: Secondary | ICD-10-CM | POA: Diagnosis not present

## 2021-04-18 DIAGNOSIS — L89620 Pressure ulcer of left heel, unstageable: Secondary | ICD-10-CM | POA: Diagnosis not present

## 2021-04-18 DIAGNOSIS — L89614 Pressure ulcer of right heel, stage 4: Secondary | ICD-10-CM | POA: Diagnosis not present

## 2021-04-18 DIAGNOSIS — L89613 Pressure ulcer of right heel, stage 3: Secondary | ICD-10-CM | POA: Diagnosis not present

## 2021-04-25 DIAGNOSIS — L89620 Pressure ulcer of left heel, unstageable: Secondary | ICD-10-CM | POA: Diagnosis not present

## 2021-04-25 DIAGNOSIS — L89613 Pressure ulcer of right heel, stage 3: Secondary | ICD-10-CM | POA: Diagnosis not present

## 2021-04-25 DIAGNOSIS — L89153 Pressure ulcer of sacral region, stage 3: Secondary | ICD-10-CM | POA: Diagnosis not present

## 2021-04-25 DIAGNOSIS — L89614 Pressure ulcer of right heel, stage 4: Secondary | ICD-10-CM | POA: Diagnosis not present

## 2021-11-15 DIAGNOSIS — N1831 Chronic kidney disease, stage 3a: Secondary | ICD-10-CM | POA: Diagnosis not present

## 2021-11-15 DIAGNOSIS — L89153 Pressure ulcer of sacral region, stage 3: Secondary | ICD-10-CM | POA: Diagnosis not present

## 2021-11-15 DIAGNOSIS — L089 Local infection of the skin and subcutaneous tissue, unspecified: Secondary | ICD-10-CM | POA: Diagnosis not present

## 2021-11-15 DIAGNOSIS — R262 Difficulty in walking, not elsewhere classified: Secondary | ICD-10-CM | POA: Diagnosis not present

## 2021-11-15 DIAGNOSIS — L89613 Pressure ulcer of right heel, stage 3: Secondary | ICD-10-CM | POA: Diagnosis not present

## 2021-11-15 DIAGNOSIS — E43 Unspecified severe protein-calorie malnutrition: Secondary | ICD-10-CM | POA: Diagnosis not present

## 2021-11-15 DIAGNOSIS — R739 Hyperglycemia, unspecified: Secondary | ICD-10-CM | POA: Diagnosis not present

## 2021-11-15 DIAGNOSIS — F0390 Unspecified dementia without behavioral disturbance: Secondary | ICD-10-CM | POA: Diagnosis not present

## 2021-11-15 DIAGNOSIS — I10 Essential (primary) hypertension: Secondary | ICD-10-CM | POA: Diagnosis not present

## 2022-06-12 IMAGING — CR DG SHOULDER 2+V*L*
3 series · 3 of 3 positions shown · non-contrast
Comparison: None.

CLINICAL DATA: Shoulder pain

EXAM:
LEFT SHOULDER - 2+ VIEW

[w shoulder ap external left *]
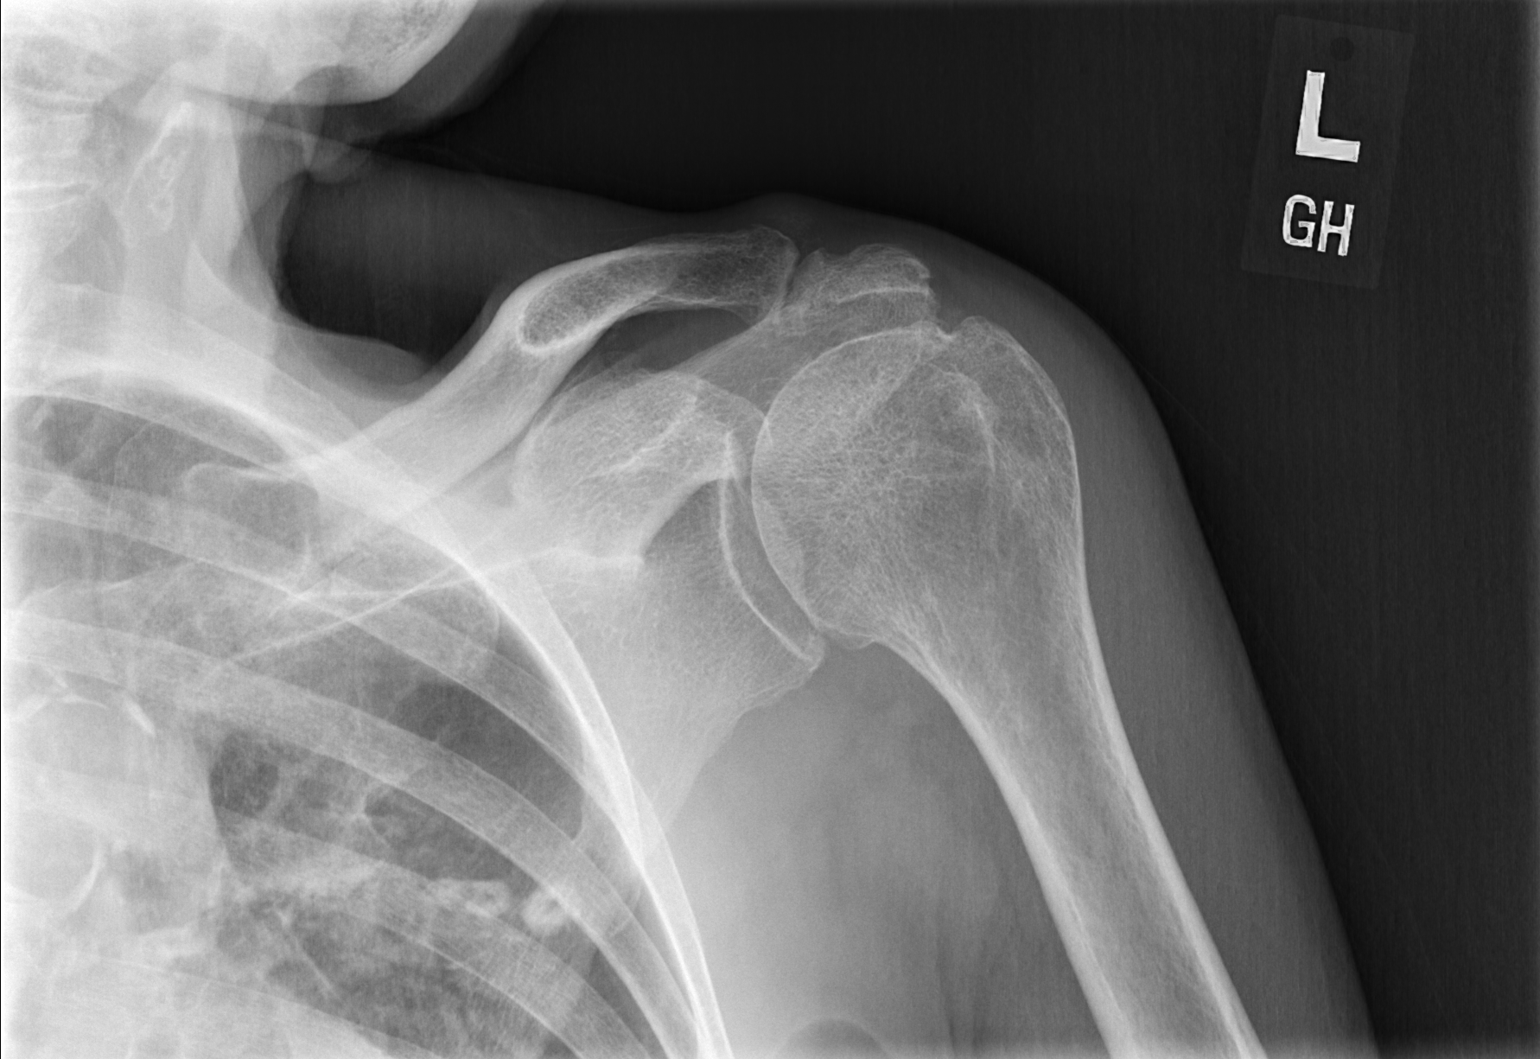

[w shoulder y view left *]
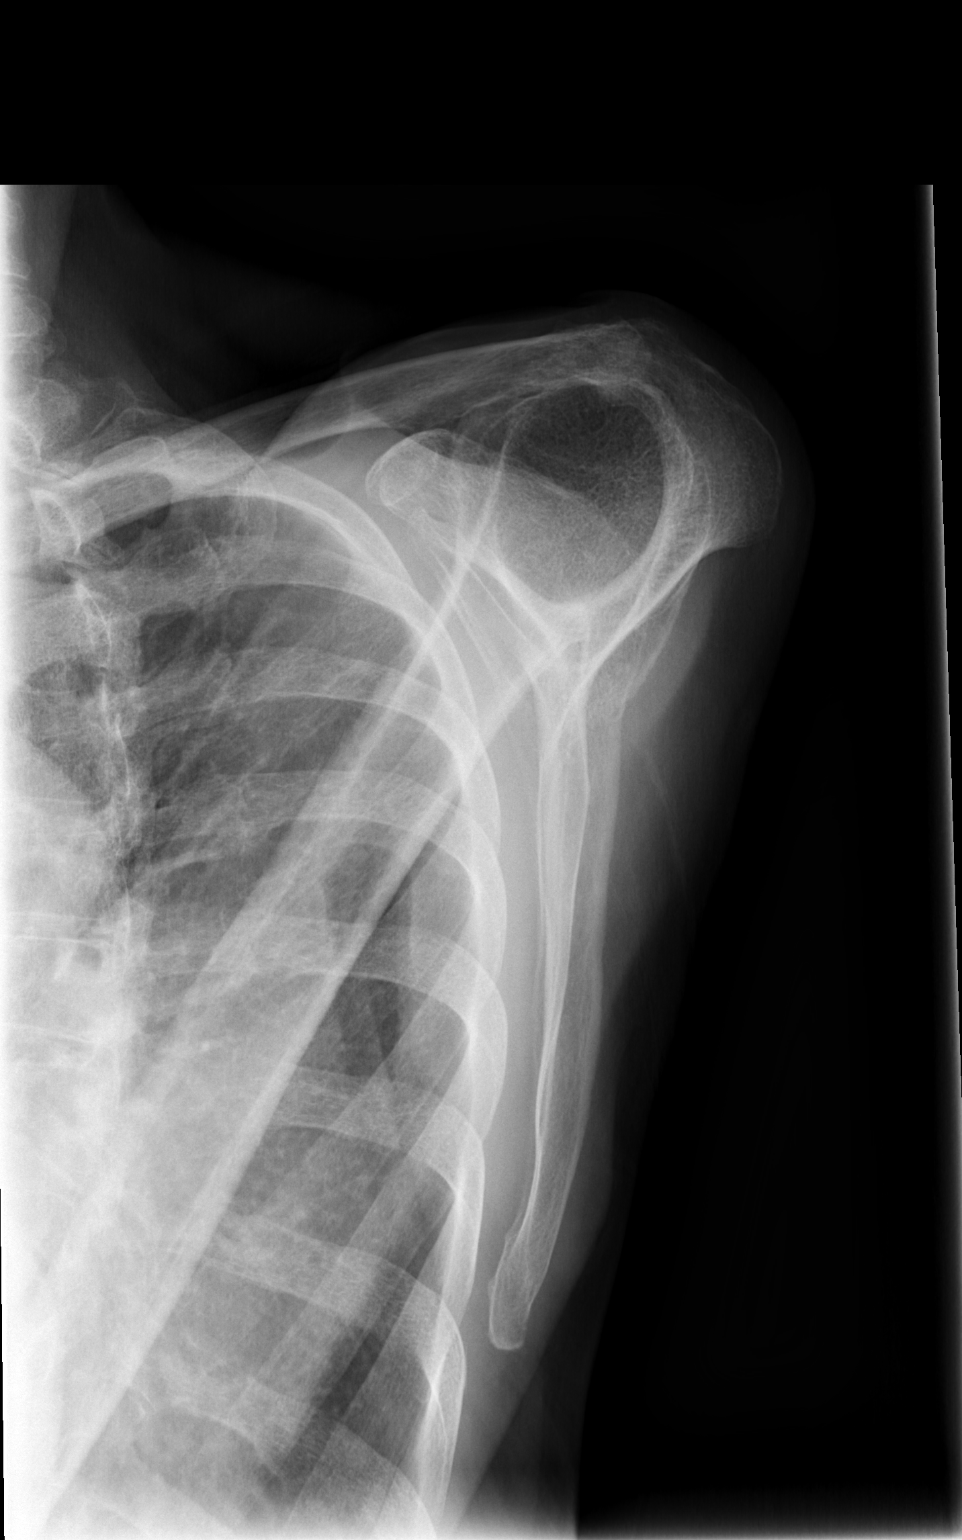

[w shoulder axillary left *]
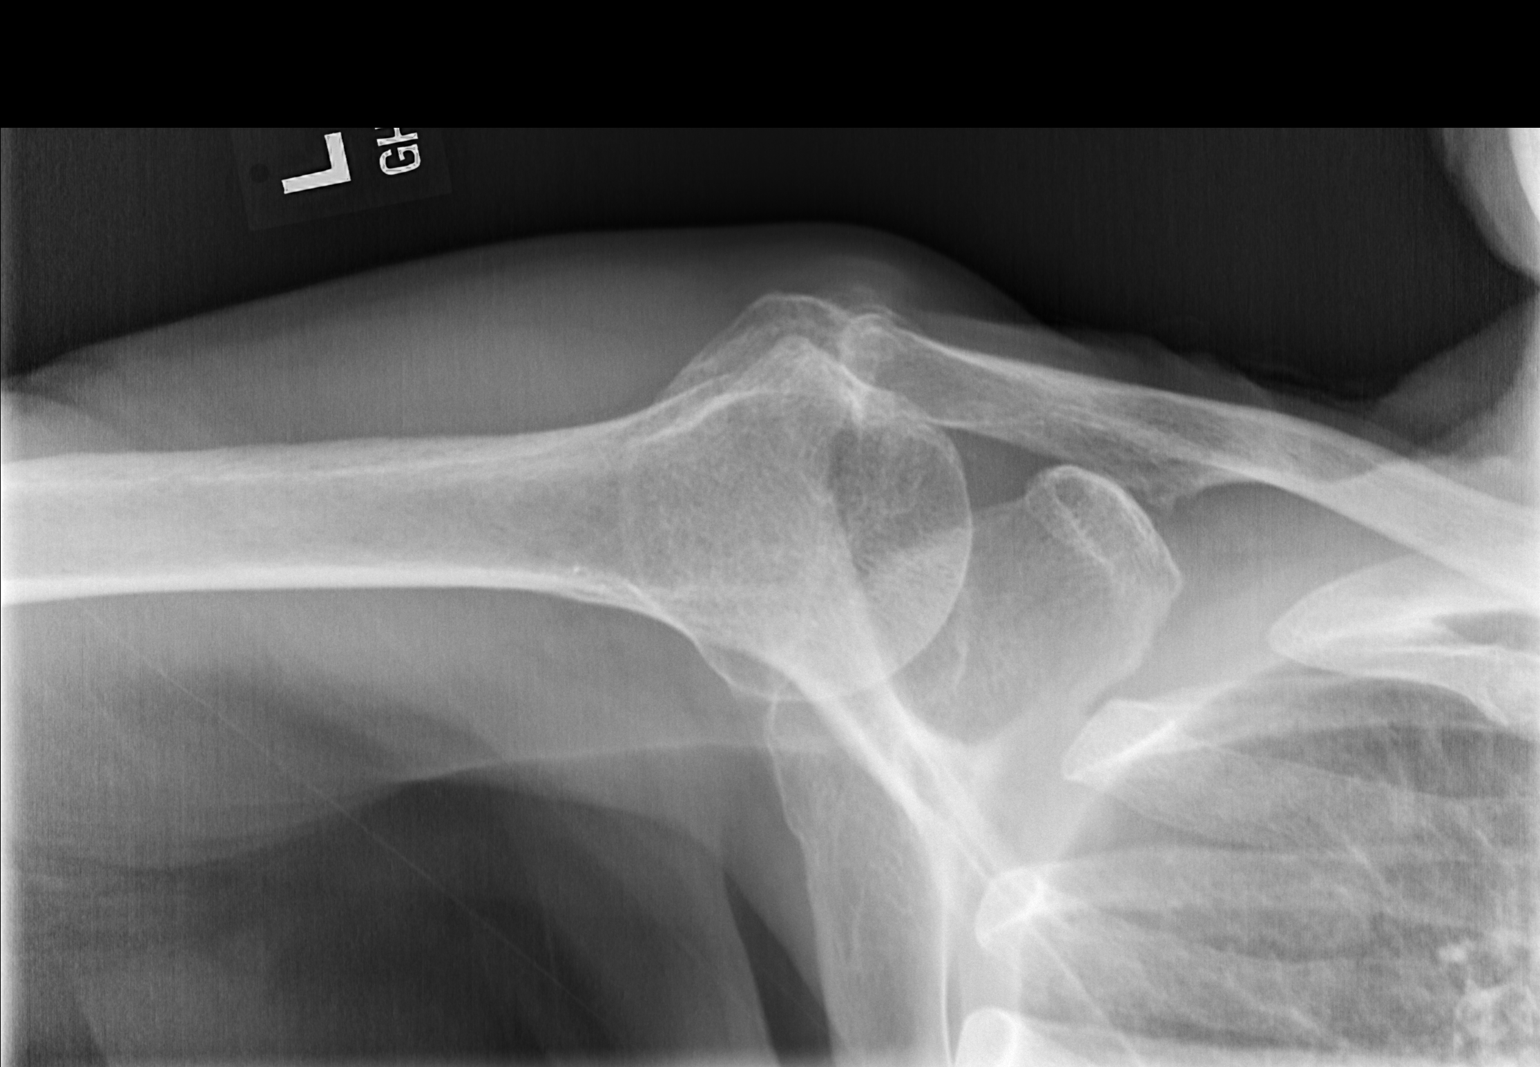

[3 of 3 positions shown; findings below may reference images not displayed]

FINDINGS: No fracture or malalignment. Moderate AC joint degenerative change
and mild glenohumeral degenerative change. Narrowed appearing
subacromial space suggesting rotator cuff disease.
IMPRESSION: 1. Degenerative changes of the left shoulder
2. Mild narrowing of subacromial space suggesting rotator cuff
disease
